# Patient Record
Sex: Female | Born: 1955 | Race: Black or African American | Hispanic: No | Marital: Single | Smoking: Light tobacco smoker
Health system: Southern US, Community
[De-identification: ages and names within clinical notes are randomized; demographics above are authoritative.]

## PROBLEM LIST (undated history)

## (undated) DIAGNOSIS — R531 Weakness: Secondary | ICD-10-CM

## (undated) DIAGNOSIS — M542 Cervicalgia: Secondary | ICD-10-CM

## (undated) DIAGNOSIS — M797 Fibromyalgia: Secondary | ICD-10-CM

## (undated) DIAGNOSIS — I639 Cerebral infarction, unspecified: Secondary | ICD-10-CM

## (undated) DIAGNOSIS — R739 Hyperglycemia, unspecified: Secondary | ICD-10-CM

## (undated) DIAGNOSIS — D649 Anemia, unspecified: Secondary | ICD-10-CM

## (undated) DIAGNOSIS — R202 Paresthesia of skin: Secondary | ICD-10-CM

## (undated) DIAGNOSIS — K219 Gastro-esophageal reflux disease without esophagitis: Secondary | ICD-10-CM

## (undated) DIAGNOSIS — G4730 Sleep apnea, unspecified: Secondary | ICD-10-CM

## (undated) HISTORY — PX: BREAST SURGERY: SHX581

## (undated) HISTORY — PX: CHOLECYSTECTOMY: SHX55

## (undated) HISTORY — PX: DIAGNOSTIC LAPAROSCOPY WITH REMOVAL OF ECTOPIC PREGNANCY: SHX6449

---

## 2007-06-24 ENCOUNTER — Other Ambulatory Visit: Payer: Self-pay

## 2007-06-24 ENCOUNTER — Emergency Department: Payer: Self-pay | Admitting: Unknown Physician Specialty

## 2007-06-25 ENCOUNTER — Ambulatory Visit: Payer: Self-pay | Admitting: Unknown Physician Specialty

## 2008-12-04 ENCOUNTER — Emergency Department (HOSPITAL_COMMUNITY): Admission: EM | Admit: 2008-12-04 | Discharge: 2008-12-04 | Payer: Self-pay | Admitting: Emergency Medicine

## 2009-02-07 ENCOUNTER — Encounter: Admission: RE | Admit: 2009-02-07 | Discharge: 2009-02-07 | Payer: Self-pay | Admitting: Otolaryngology

## 2009-09-26 ENCOUNTER — Ambulatory Visit: Payer: Self-pay

## 2009-10-11 ENCOUNTER — Ambulatory Visit: Payer: Self-pay

## 2009-10-16 ENCOUNTER — Ambulatory Visit: Payer: Self-pay | Admitting: General Surgery

## 2009-10-23 ENCOUNTER — Ambulatory Visit: Payer: Self-pay | Admitting: General Surgery

## 2010-09-24 ENCOUNTER — Ambulatory Visit: Payer: Self-pay | Admitting: Cardiology

## 2010-09-24 ENCOUNTER — Inpatient Hospital Stay: Payer: Self-pay | Admitting: Internal Medicine

## 2010-12-01 ENCOUNTER — Emergency Department: Payer: Self-pay | Admitting: Emergency Medicine

## 2011-10-04 LAB — HEPATIC FUNCTION PANEL
ALT: 41 U/L — ABNORMAL HIGH (ref 0–35)
Albumin: 3.6 g/dL (ref 3.5–5.2)
Alkaline Phosphatase: 55 U/L (ref 39–117)
Total Protein: 6.9 g/dL (ref 6.0–8.3)

## 2011-10-04 LAB — CBC
Platelets: 325 10*3/uL (ref 150–400)
WBC: 5 10*3/uL (ref 4.0–10.5)

## 2011-10-04 LAB — DIFFERENTIAL
Lymphocytes Relative: 38 % (ref 12–46)
Lymphs Abs: 1.9 10*3/uL (ref 0.7–4.0)
Neutro Abs: 2.4 10*3/uL (ref 1.7–7.7)
Neutrophils Relative %: 48 % (ref 43–77)

## 2011-10-04 LAB — BASIC METABOLIC PANEL
BUN: 9 mg/dL (ref 6–23)
Creatinine, Ser: 0.72 mg/dL (ref 0.4–1.2)
GFR calc non Af Amer: >60 mL/min (ref >60)
Potassium: 4.3 mEq/L (ref 3.5–5.1)

## 2011-10-04 LAB — URINE MICROSCOPIC-ADD ON

## 2011-10-04 LAB — URINALYSIS, ROUTINE W REFLEX MICROSCOPIC
Glucose, UA: NEGATIVE mg/dL
Nitrite: NEGATIVE
Specific Gravity, Urine: 1.025 (ref 1.005–1.030)
pH: 7 (ref 5.0–8.0)

## 2011-10-04 LAB — LIPASE, BLOOD: Lipase: 39 U/L (ref 11–59)

## 2013-07-07 ENCOUNTER — Emergency Department: Payer: Self-pay | Admitting: Emergency Medicine

## 2013-07-07 LAB — URINALYSIS, COMPLETE
Bilirubin,UR: NEGATIVE
Ketone: NEGATIVE
Leukocyte Esterase: NEGATIVE
Nitrite: NEGATIVE
Ph: 6 (ref 4.5–8.0)
Protein: NEGATIVE
RBC,UR: 8 /HPF (ref 0–5)
Specific Gravity: 1.019 (ref 1.003–1.030)
Squamous Epithelial: 6
WBC UR: <1 /HPF (ref 0–5)

## 2013-07-07 LAB — CBC
HCT: 37.5 % (ref 35.0–47.0)
MCV: 74 fL — ABNORMAL LOW (ref 80–100)
Platelet: 390 10*3/uL (ref 150–440)

## 2013-07-07 LAB — CK TOTAL AND CKMB (NOT AT ARMC): CK, Total: 57 U/L (ref 21–215)

## 2013-07-07 LAB — TROPONIN I
Troponin-I: <0.02 ng/mL
Troponin-I: <0.02 ng/mL

## 2013-07-07 LAB — COMPREHENSIVE METABOLIC PANEL
Alkaline Phosphatase: 71 U/L (ref 50–136)
Anion Gap: 7 (ref 7–16)
BUN: 7 mg/dL (ref 7–18)
Creatinine: 0.74 mg/dL (ref 0.60–1.30)
Osmolality: 271 (ref 275–301)
SGOT(AST): 17 U/L (ref 15–37)
SGPT (ALT): 19 U/L (ref 12–78)
Sodium: 137 mmol/L (ref 136–145)

## 2013-07-07 LAB — MAGNESIUM: Magnesium: 2.1 mg/dL

## 2013-07-07 LAB — LIPASE, BLOOD: Lipase: 183 U/L (ref 73–393)

## 2014-08-11 IMAGING — CT CT CHEST-ABD W/ CM
1 of 3 series · 14 of 36 positions shown, 19 images · IV contrast (APPLIED)
Comparison: none

REASON FOR EXAM: (1) epig pain rad to back with dropo in bp; (2) same;
NOTE: Nursing to Give O
COMMENTS:

[Series 4: soft tissue · axial · 0.68mm/px · z∈[-560,-232]mm · 14 of 125 slices shown, 19 images]
[im 8/125  mediastinal]
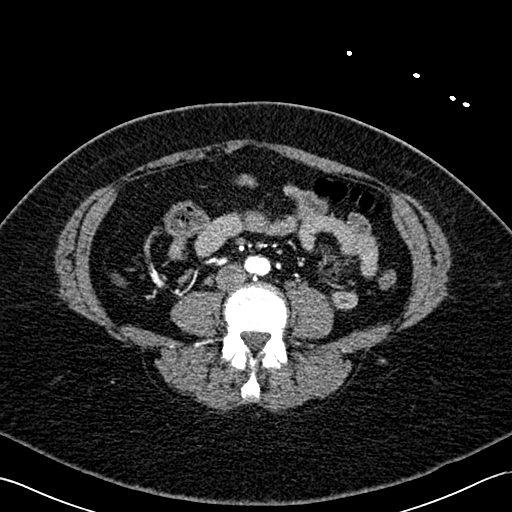
[im 8/125  bone]
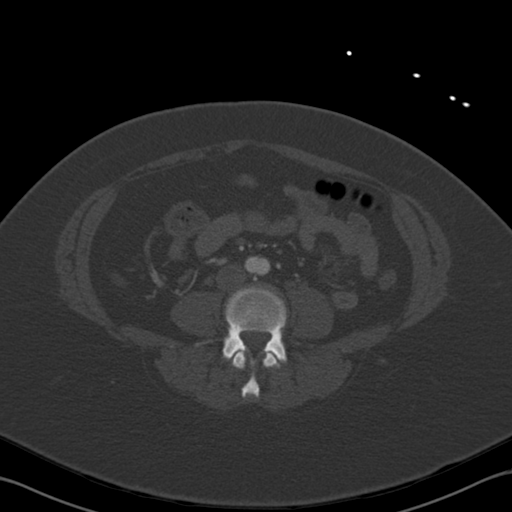
[im 16/125  mediastinal]
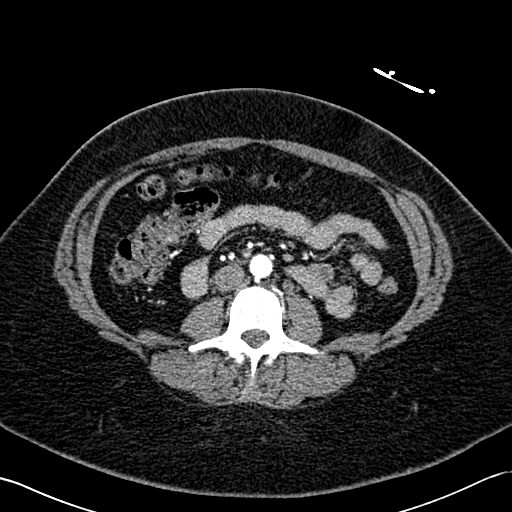
[im 32/125  mediastinal]
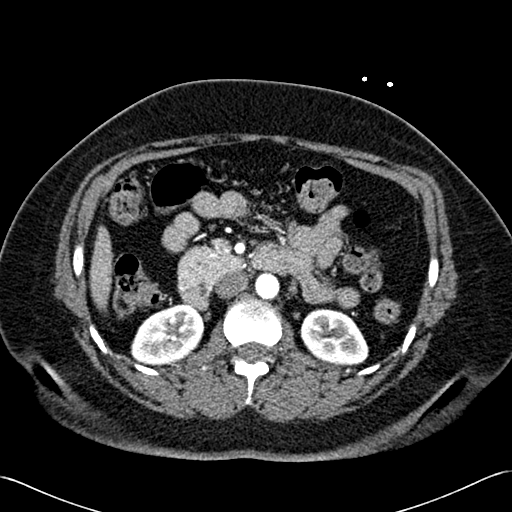
[im 39/125  mediastinal]
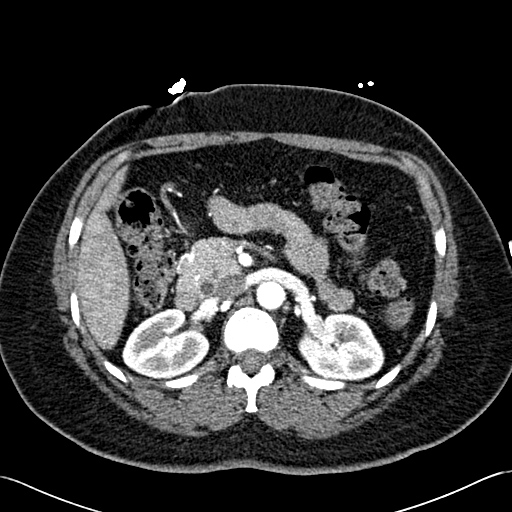
[im 47/125  mediastinal]
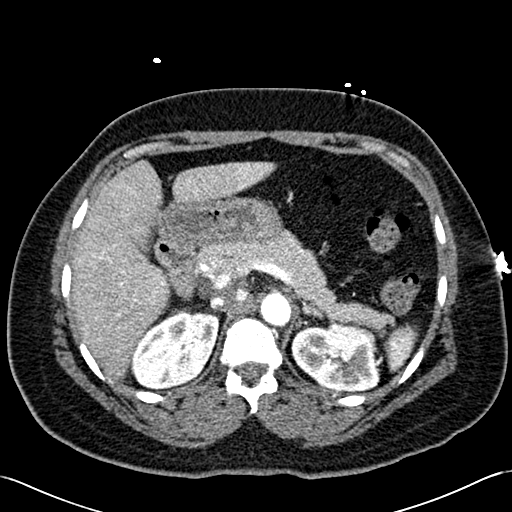
[im 55/125  mediastinal]
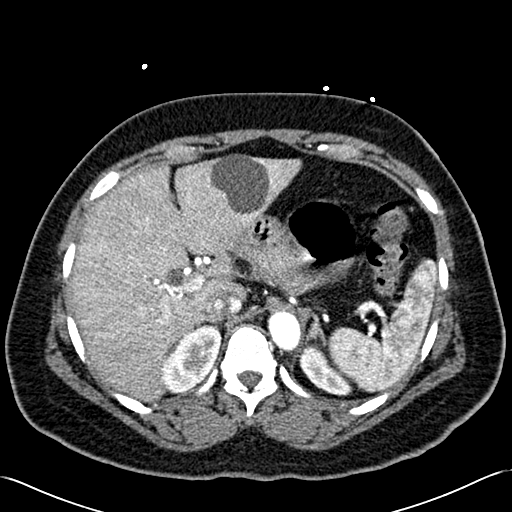
[im 63/125  mediastinal]
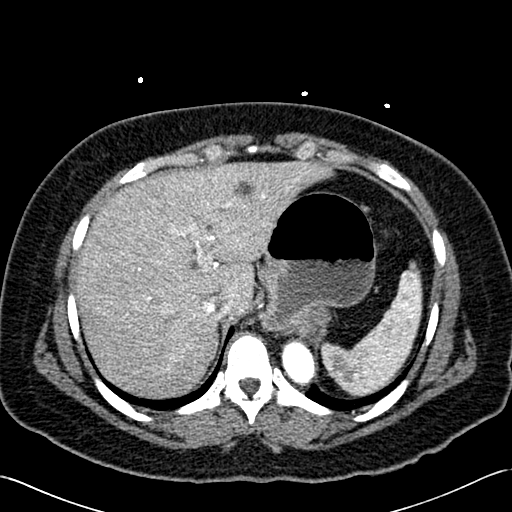
[im 70/125  mediastinal]
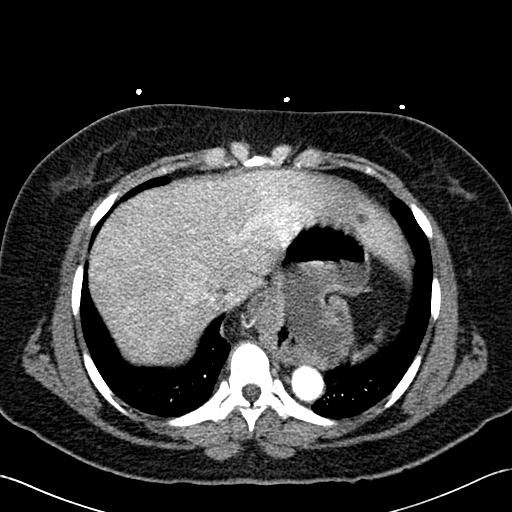
[im 78/125  mediastinal]
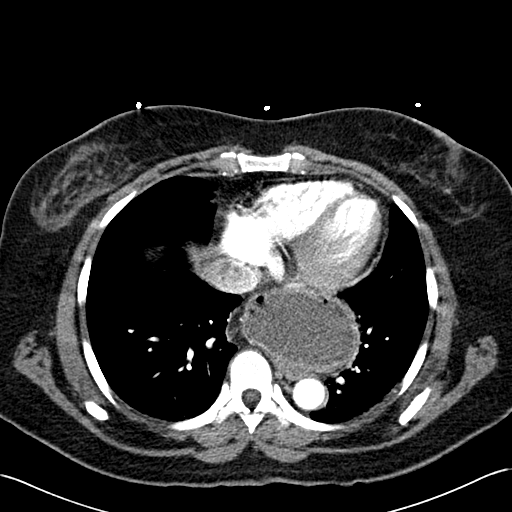
[im 78/125  bone]
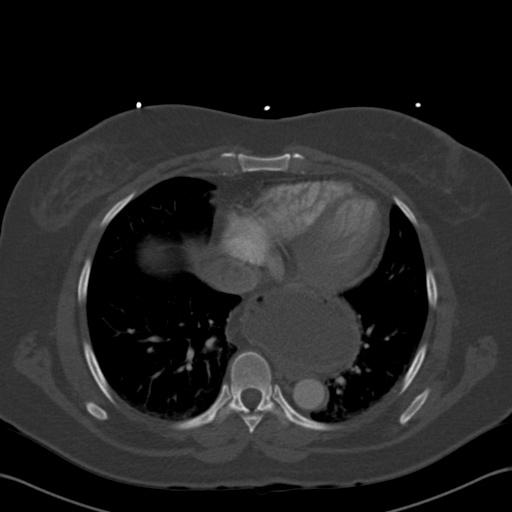
[im 86/125  mediastinal]
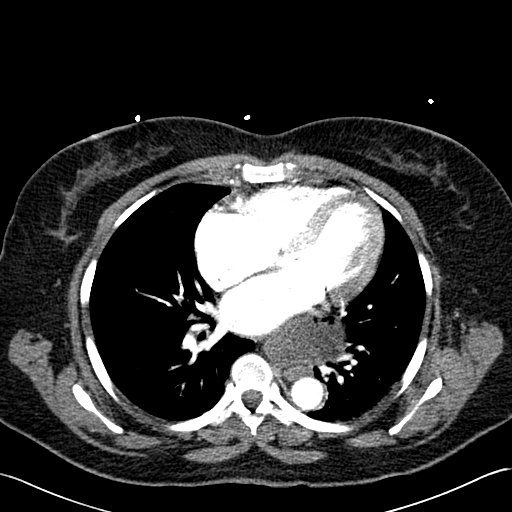
[im 94/125  mediastinal]
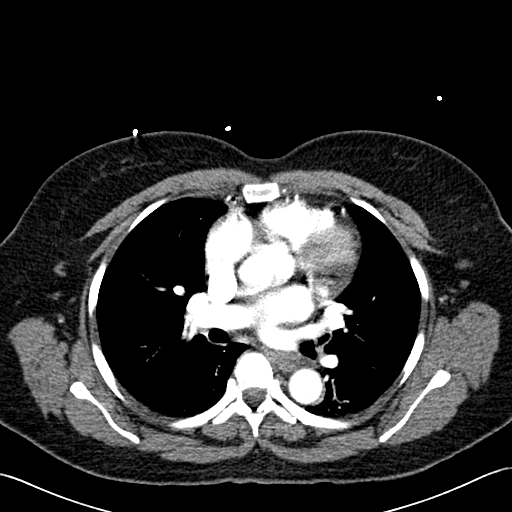
[im 94/125  lung]
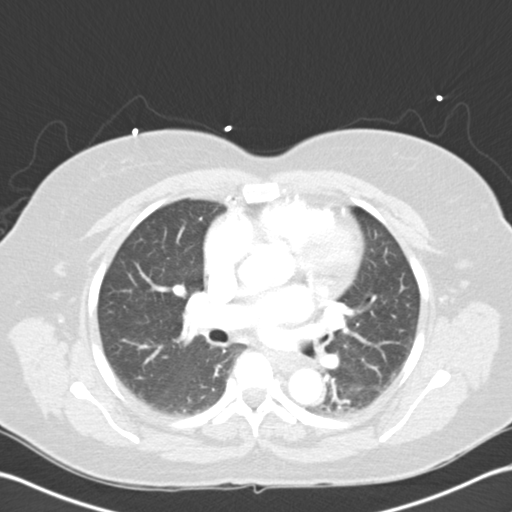
[im 101/125  lung]
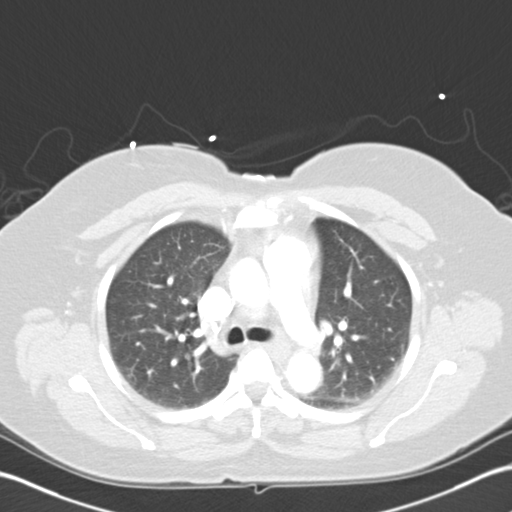
[im 109/125  mediastinal]
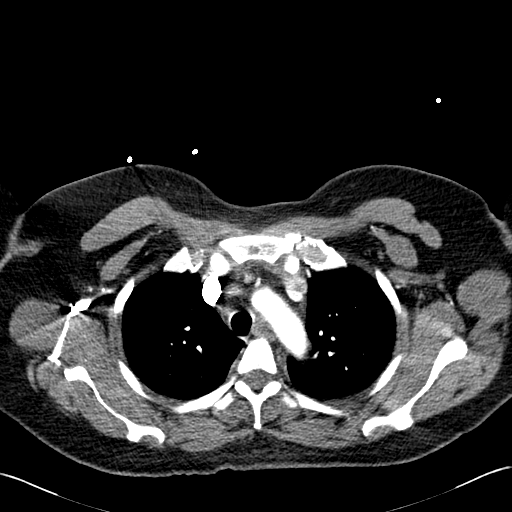
[im 109/125  lung]
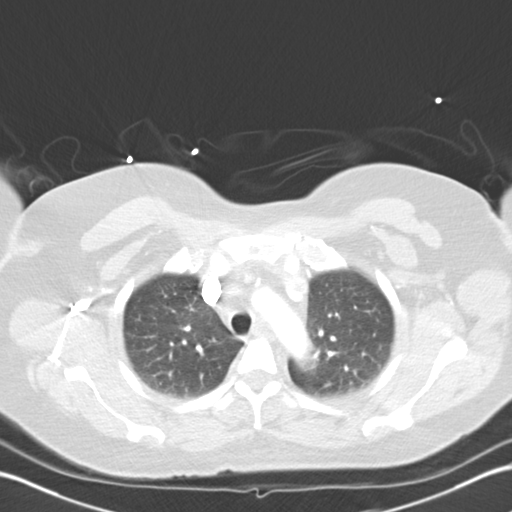
[im 117/125  mediastinal]
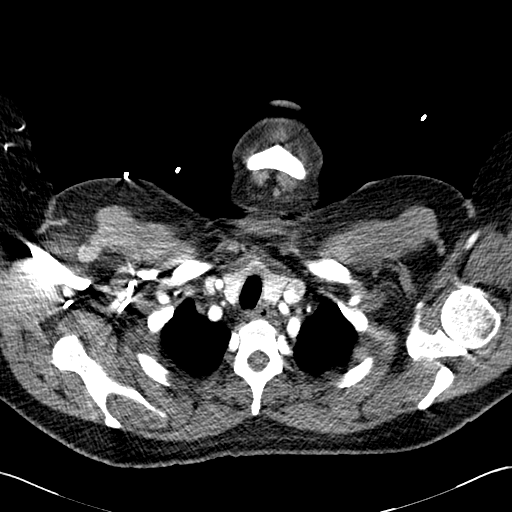
[im 117/125  lung]
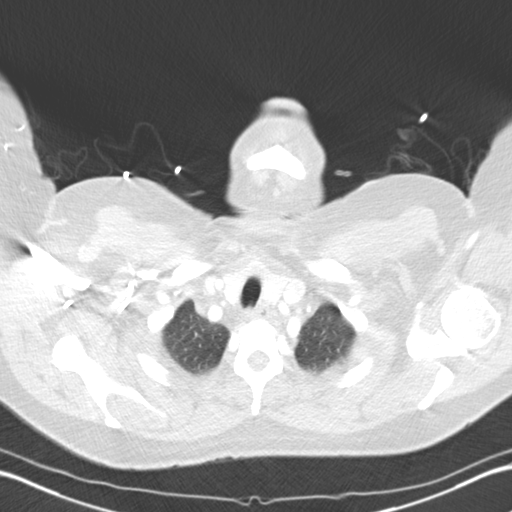

[14 of 36 positions shown; findings below may reference images not displayed]

PROCEDURE:     CT  - CT CHEST AND ABDOMEN W  - July 07, 2013  [DATE]

RESULT:     History: Pain

Comparison Study: Chest x-ray of 07/07/2013. Findings: Standard CT obtained
under 25 cc of Esovue-84C. Thoracic aorta unremarkable. Only arteries are
normal. Heart size normal. Large sliding hiatal hernia. Old 4 cm simple cyst
left lobe of liver. 7 millimeters cyst in the left lobe of liver. Spleen
normal ;splenosis. Pancreas normal. Mild common bile duct distention most
likely from prior cholecystectomy. Cholecystectomy. Adrenals normal. No
focal renal abnormality or hydronephrosis. Abdominal aorta normal in
caliber. Shotty retroperitoneal lymph nodes. Lungs clear. No free air. No
acute bony abnormality.
IMPRESSION: Large sliding hiatal hernia. No acute abnormality.

## 2014-09-09 ENCOUNTER — Other Ambulatory Visit: Payer: Self-pay | Admitting: Neurology

## 2014-09-09 DIAGNOSIS — M4802 Spinal stenosis, cervical region: Secondary | ICD-10-CM

## 2014-09-09 DIAGNOSIS — R202 Paresthesia of skin: Secondary | ICD-10-CM

## 2014-09-09 DIAGNOSIS — R531 Weakness: Secondary | ICD-10-CM

## 2014-09-09 DIAGNOSIS — M542 Cervicalgia: Secondary | ICD-10-CM

## 2014-09-12 ENCOUNTER — Other Ambulatory Visit: Payer: Self-pay | Admitting: Neurology

## 2014-09-18 ENCOUNTER — Ambulatory Visit
Admission: RE | Admit: 2014-09-18 | Discharge: 2014-09-18 | Disposition: A | Discharge status: Home / Self Care | Payer: Managed Care, Other (non HMO) | Source: Ambulatory Visit | Attending: Neurology | Admitting: Neurology

## 2014-09-18 DIAGNOSIS — M542 Cervicalgia: Secondary | ICD-10-CM

## 2014-09-18 DIAGNOSIS — M4802 Spinal stenosis, cervical region: Secondary | ICD-10-CM

## 2014-09-18 DIAGNOSIS — R202 Paresthesia of skin: Secondary | ICD-10-CM

## 2014-09-18 DIAGNOSIS — R531 Weakness: Secondary | ICD-10-CM

## 2014-12-28 LAB — COMPREHENSIVE METABOLIC PANEL
ALK PHOS: 63 U/L
ALT: 114 U/L — AB
ANION GAP: 6 — AB (ref 7–16)
AST: 77 U/L — AB (ref 15–37)
Albumin: 3.6 g/dL (ref 3.4–5.0)
BUN: 15 mg/dL (ref 7–18)
Bilirubin,Total: 0.5 mg/dL (ref 0.2–1.0)
CHLORIDE: 106 mmol/L (ref 98–107)
CREATININE: 0.8 mg/dL (ref 0.60–1.30)
Calcium, Total: 8.6 mg/dL (ref 8.5–10.1)
Co2: 26 mmol/L (ref 21–32)
Glucose: 98 mg/dL (ref 65–99)
Osmolality: 276 (ref 275–301)
POTASSIUM: 3.7 mmol/L (ref 3.5–5.1)
SODIUM: 138 mmol/L (ref 136–145)
TOTAL PROTEIN: 7.2 g/dL (ref 6.4–8.2)

## 2014-12-28 LAB — CBC
HCT: 24.4 % — ABNORMAL LOW (ref 35.0–47.0)
HGB: 7.3 g/dL — AB (ref 12.0–16.0)
MCH: 19 pg — ABNORMAL LOW (ref 26.0–34.0)
MCHC: 29.7 g/dL — AB (ref 32.0–36.0)
MCV: 64 fL — AB (ref 80–100)
Platelet: 382 10*3/uL (ref 150–440)
RBC: 3.82 10*6/uL (ref 3.80–5.20)
RDW: 21 % — AB (ref 11.5–14.5)
WBC: 9.2 10*3/uL (ref 3.6–11.0)

## 2014-12-28 LAB — URINALYSIS, COMPLETE
BILIRUBIN, UR: NEGATIVE
BLOOD: NEGATIVE
Bacteria: NONE SEEN
Glucose,UR: NEGATIVE mg/dL (ref 0–75)
Ketone: NEGATIVE
Leukocyte Esterase: NEGATIVE
NITRITE: NEGATIVE
PH: 7 (ref 4.5–8.0)
PROTEIN: NEGATIVE
RBC, UR: NONE SEEN /HPF (ref 0–5)
SPECIFIC GRAVITY: 1.003 (ref 1.003–1.030)
Squamous Epithelial: 2
WBC UR: <1 /HPF (ref 0–5)

## 2014-12-28 LAB — ACETAMINOPHEN LEVEL

## 2014-12-28 LAB — DRUG SCREEN, URINE

## 2014-12-28 LAB — SALICYLATE LEVEL: Salicylates, Serum: <1.7 mg/dL

## 2014-12-28 LAB — ETHANOL

## 2014-12-28 LAB — TROPONIN I

## 2014-12-29 ENCOUNTER — Inpatient Hospital Stay: Payer: Self-pay | Admitting: Internal Medicine

## 2014-12-29 LAB — HEMATOCRIT
HCT: 25.9 % — ABNORMAL LOW (ref 35.0–47.0)
HCT: 29.9 % — ABNORMAL LOW (ref 35.0–47.0)

## 2014-12-29 LAB — HEMOGLOBIN
HGB: 6.3 g/dL — ABNORMAL LOW (ref 12.0–16.0)
HGB: 7.8 g/dL — AB (ref 12.0–16.0)
HGB: 9.3 g/dL — AB (ref 12.0–16.0)

## 2014-12-29 LAB — IRON AND TIBC
IRON SATURATION: 14 %
Iron Bind.Cap.(Total): 433 ug/dL (ref 250–450)
Iron: 62 ug/dL (ref 50–170)
UNBOUND IRON-BIND. CAP.: 371 ug/dL

## 2014-12-30 LAB — CBC WITH DIFFERENTIAL/PLATELET
BASOS ABS: 0.1 10*3/uL (ref 0.0–0.1)
Basophil %: 1.3 %
EOS ABS: 0.3 10*3/uL (ref 0.0–0.7)
Eosinophil %: 4.4 %
HCT: 31.6 % — AB (ref 35.0–47.0)
HGB: 9.6 g/dL — AB (ref 12.0–16.0)
LYMPHS PCT: 29.2 %
Lymphocyte #: 2 10*3/uL (ref 1.0–3.6)
MCH: 21.9 pg — ABNORMAL LOW (ref 26.0–34.0)
MCHC: 30.4 g/dL — ABNORMAL LOW (ref 32.0–36.0)
MCV: 72 fL — ABNORMAL LOW (ref 80–100)
MONO ABS: 0.5 x10 3/mm (ref 0.2–0.9)
MONOS PCT: 7.3 %
NEUTROS PCT: 57.8 %
Neutrophil #: 4 10*3/uL (ref 1.4–6.5)
Platelet: 317 10*3/uL (ref 150–440)
RBC: 4.37 10*6/uL (ref 3.80–5.20)
RDW: 26.5 % — AB (ref 11.5–14.5)
WBC: 6.9 10*3/uL (ref 3.6–11.0)

## 2014-12-30 LAB — BASIC METABOLIC PANEL
Anion Gap: 9 (ref 7–16)
BUN: 7 mg/dL (ref 7–18)
CREATININE: 0.79 mg/dL (ref 0.60–1.30)
Calcium, Total: 8.1 mg/dL — ABNORMAL LOW (ref 8.5–10.1)
Chloride: 111 mmol/L — ABNORMAL HIGH (ref 98–107)
Co2: 23 mmol/L (ref 21–32)
EGFR (African American): >60
GLUCOSE: 70 mg/dL (ref 65–99)
OSMOLALITY: 281 (ref 275–301)
POTASSIUM: 3.8 mmol/L (ref 3.5–5.1)
SODIUM: 143 mmol/L (ref 136–145)

## 2014-12-30 LAB — HEMATOCRIT: HCT: 29.8 % — AB (ref 35.0–47.0)

## 2014-12-30 LAB — PROTIME-INR
INR: 1
Prothrombin Time: 13.5 secs (ref 11.5–14.7)

## 2014-12-30 LAB — HEMOGLOBIN: HGB: 9.3 g/dL — AB (ref 12.0–16.0)

## 2014-12-30 LAB — LIPASE, BLOOD: LIPASE: 271 U/L (ref 73–393)

## 2014-12-31 LAB — CBC WITH DIFFERENTIAL/PLATELET
BASOS ABS: 0.1 10*3/uL (ref 0.0–0.1)
Basophil %: 1 %
EOS ABS: 0.2 10*3/uL (ref 0.0–0.7)
Eosinophil %: 3 %
HCT: 28.6 % — ABNORMAL LOW (ref 35.0–47.0)
HGB: 9 g/dL — ABNORMAL LOW (ref 12.0–16.0)
Lymphocyte #: 1.7 10*3/uL (ref 1.0–3.6)
Lymphocyte %: 27 %
MCH: 22.3 pg — ABNORMAL LOW (ref 26.0–34.0)
MCHC: 31.6 g/dL — ABNORMAL LOW (ref 32.0–36.0)
MCV: 71 fL — ABNORMAL LOW (ref 80–100)
Monocyte #: 0.5 x10 3/mm (ref 0.2–0.9)
Monocyte %: 8 %
Neutrophil #: 3.8 10*3/uL (ref 1.4–6.5)
Neutrophil %: 61 %
PLATELETS: 298 10*3/uL (ref 150–440)
RBC: 4.05 10*6/uL (ref 3.80–5.20)
RDW: 26.8 % — AB (ref 11.5–14.5)
WBC: 6.3 10*3/uL (ref 3.6–11.0)

## 2015-04-03 ENCOUNTER — Ambulatory Visit
Admit: 2015-04-03 | Disposition: A | Discharge status: Home / Self Care | Payer: Self-pay | Attending: Nurse Practitioner | Admitting: Nurse Practitioner

## 2015-04-07 ENCOUNTER — Telehealth: Payer: Self-pay | Admitting: Gastroenterology

## 2015-04-07 NOTE — Telephone Encounter (Signed)
Records were previously reviewed and faxed.  Reviewed by both Dr, Christella HartiganJacobs and Dr. Russella DarStark.  Patient should remain with her current GI.

## 2015-04-07 NOTE — Telephone Encounter (Signed)
Notes are not in Care Everywhere.  [facility].

## 2015-04-22 NOTE — Consult Note (Signed)
Brief Consult Note: Diagnosis: coffee ground emesis, abdominal pain, melena.   Patient was seen by consultant.   Consult note dictated.   Recommend to proceed with surgery or procedure.   Recommend further assessment or treatment.   Orders entered.   Discussed with Attending MD.   Comments: Please seee full Gi consult [phone]#[id number].  Patient admitted with anemia and hematemesis.  hemodynamically stable.  Found to have a marked microcytic anemia, inferring perhaps not just acute anemia blood loss, but likely chronic loss.  Will proceed with EGD tomorrow.  I have discussed the risks benefits and complications of egd to include not limited to bleeding infection perforationa dn sedationa dnshe wishes to proceed.  Will also need colonoscopy when clinically feasible.  also noted abnormal lfts.  Will obtain iron studies on admission blood (has now been transfused) and serologies for chronic hepatitis.   Following.  continue ppi as you are.  Electronic Signatures: Barnetta ChapelSkulskie, Blase Beckner (MD)  (Signed 31-Dec-15 18:27)  Authored: Brief Consult Note   Last Updated: 31-Dec-15 18:27 by Barnetta ChapelSkulskie, Harlis Champoux (MD)

## 2015-04-24 LAB — SURGICAL PATHOLOGY

## 2015-04-26 NOTE — Consult Note (Signed)
Chief Complaint:  Subjective/Chief Complaint seen for abdominal pain and hematemesis/coffee ground.  feeling some better today.  continues with upper abdominal pain but no nausea or emesis, no bm.   VITAL SIGNS/ANCILLARY NOTES: **Vital Signs.:   01-Jan-16 07:48  Vital Signs Type Q 8hr  Temperature Temperature (F) 98  Celsius 36.6  Temperature Source oral  Respirations Respirations 18  Systolic BP Systolic BP 415  Diastolic BP (mmHg) Diastolic BP (mmHg) 80  Mean BP 98  Pulse Ox % Pulse Ox % 98  Pulse Ox Activity Level  At rest  Oxygen Delivery Room Air/ 21 %  *Intake and Output.:   Shift 01-Jan-16 15:00  Length of Stay Totals Intake:  3056.34 Output:  3380    Net:  -323.66   Brief Assessment:  Cardiac Regular   Respiratory clear BS   Gastrointestinal details normal Soft  Nondistended  Bowel sounds normal  No rebound tenderness  tender to palpation in ruq/rlq, epigastrum.   Lab Results: Routine Chem:  01-Jan-16 05:32   Glucose, Serum 70  BUN 7  Creatinine (comp) 0.79  Sodium, Serum 143  Potassium, Serum 3.8  Chloride, Serum  111  CO2, Serum 23  Calcium (Total), Serum  8.1  Anion Gap 9  Osmolality (calc) 281  eGFR (African American) >60  eGFR (Non-African American) >60 (eGFR values <15m/min/1.73 m2 may be an indication of chronic kidney disease (CKD). Calculated eGFR, using the MRDR Study equation, is useful in  patients with stable renal function. The eGFR calculation will not be reliable in acutely ill patients when serum creatinine is changing rapidly. It is not useful in patients on dialysis. The eGFR calculation may not be applicable to patients at the low and high extremes of body sizes, pregnant women, and vegetarians.)  Routine Coag:  01-Jan-16 05:32   Prothrombin 13.5  INR 1.0 (INR reference interval applies to patients on anticoagulant therapy. A single INR therapeutic range for coumarins is not optimal for all indications; however, the suggested  range for most indications is 2.0 - 3.0. Exceptions to the INR Reference Range may include: Prosthetic heart valves, acute myocardial infarction, prevention of myocardial infarction, and combinations of aspirin and anticoagulant. The need for a higher or lower target INR must be assessed individually. Reference: The Pharmacology and Management of the Vitamin K  antagonists: the seventh ACCP Conference on Antithrombotic and Thrombolytic Therapy. CAXENM.0768Sept:126 (3suppl): [id number] A HCT value >55% may artifactually increase the PT.  In one study,  the increase was an average of 25%. Reference:  "Effect on Routine and Special Coagulation Testing Values of Citrate Anticoagulant Adjustment in Patients with High HCT Values." American Journal of Clinical Pathology 2006;126:400-405.)  Routine Hem:  01-Jan-16 05:32   WBC (CBC) 6.9  RBC (CBC) 4.37  Hemoglobin (CBC)  9.6  Hematocrit (CBC)  31.6  Platelet Count (CBC) 317  MCV  72  MCH  21.9  MCHC  30.4  RDW  26.5  Neutrophil % 57.8  Lymphocyte % 29.2  Monocyte % 7.3  Eosinophil % 4.4  Basophil % 1.3  Neutrophil # 4.0  Lymphocyte # 2.0  Monocyte # 0.5  Eosinophil # 0.3  Basophil # 0.1 (Result(s) reported on 30 Dec 2014 at 06:50AM.)   Assessment/Plan:  Assessment/Plan:  Assessment 1) abdominal pain-slow improvement.  2) coffeeground emesis-none sine admission, no melena since admission though heme positive.  on iv ppi.  3) constipation-will start miralax bid after proceedure today. will need colonoscopy in the near future.   Plan 1)  egd today.  I have discussed the risks sbenefits and complications of proceedure to include not limited to bleeding infection perforation and sedation and she wishes to proceed.   Electronic Signatures: Loistine Simas (MD)  (Signed 01-Jan-16 09:53)  Authored: Chief Complaint, VITAL SIGNS/ANCILLARY NOTES, Brief Assessment, Lab Results, Assessment/Plan   Last Updated: 01-Jan-16 09:53 by Loistine Simas (MD)

## 2015-04-26 NOTE — Discharge Summary (Signed)
PATIENT NAME:  Julia [name], Julia [name] MR#:  [id number] DATE OF BIRTH:  June 13, 1956  DATE OF ADMISSION:  12/29/2014 DATE OF DISCHARGE:  12/31/2014  ADMITTING DIAGNOSES:  1.  Hematemesis, likely upper gastrointestinal bleed.  2.  Gastroesophageal reflux disease.  3.  Hiatal hernia.  4.  Behavioral health consult.   DISCHARGE DIAGNOSES:  1.  Upper gastrointestinal bleed from hematemesis.  2.  Large hiatal hernia.  3.  Nonbleeding erosive gastropathy.  4.  Enlarged uterus with masses probably from fibroids.  5.  Iron deficiency anemia.  6.  Behavioral health concerns.    CONSULTATIONS: GI; psychiatry; surgery, Dr. Katrinka BlazingSmith.   PROCEDURES: EGD on 12/30/2014.   HOSPITAL COURSE: The patient is a 59 year old African American female who came into the ED with a chief complaint of vomiting of blood. The patient was also complaining of abdominal pain and 4 episodes of hematemesis.   Vomiting of blood clots. The patient takes meloxicam at home for pain as needed basis. Please review history and physical for details. The patient denies any diarrhea or melena at the time of admission. The patient was admitted to the hospitalist service. She was kept n.p.o. Hemoglobin and hematocrit were monitored closely. The patient was placed on PPI. Gastroenterology consult is placed. Patient was hemodynamically stable. By the next day her hemoglobin dropped to 7.3 to 6.3; 3 units of pack red blood cells were given and hemoglobin went up to 9.6 following that.  We were monitoring hemoglobin closely. The patient was evaluated by gastroenterology, Dr. Marva PandaSkulskie. After explaining the pros and cons, the patient was scheduled for EGD. EGD was done on January 1, which showed hiatal hernia, nonbleeding erosive gastropathy, esophageal motility disorder. Dr. Marva PandaSkulskie has recommended to give proton pump inhibitor twice a day and sucralfate q. 6 hours with meals. He has recommended surgical consult because of the large hiatal hernia. The  patient was subsequently evaluated by Dr. Katrinka BlazingSmith with surgery who has recommended weight loss and colonoscopy for her iron deficiency anemia. Also, he has recommended iron supplements. The patient did not have any other episodes of hematemesis during the hospital course. Hemoglobin was stable today, her hemoglobin is at 9.3. The patient denies melena during the entire hospital course. For constipation laxatives were given, and the constipation has resolved. Dr. Katrinka BlazingSmith has recommended lifestyle changes and weight loss to manage her acid reflux. He also has recommended upper gastrointestinal series and if the patient is still considering surgery has recommended gastric emptying study and esophageal manometry. The patient is started on proton pump inhibitor during the hospital course. Sucralfate is added to the regimen, iron supplements were added. Hemoglobin is stable. After discussing with GI and surgery, decision is made to discharge the patient home in stable condition.   For hiatal hernia patient should try first try lifestyle modifications and work towards loosing weight. I have asked the patient to stop taking meloxicam and nonsteroidal anti-inflammatories. Also, have recommended the patient to stop taking peppermint and drinking alcohol which will  aggravate the acid reflux.   Iron deficiency anemia, microcytic. The patient is started on iron supplements with laxatives. The patient has a followup with gastroenterology in 3 weeks for colonoscopy. GI is considering upper GI series. Appreciate Dr. Reyes IvanSkulskie's recommendations.   possibly fibroids. The patient denies any periods at this time. She states that she is in menopause. We have recommended the patient to follow up with gynecology as an outpatient for further evaluation.   Behavioral concerns. The patient was closely observed by  a Comptroller. She denies any suicidal thoughts or ideations. No further behavioral issues were noticed during the entire hospital  course. Psychiatry consult was placed but, as it was the holiday season no inpatient psychiatric is available. The patient is to follow up with her primary care physician and she needs to get referral to psychiatry as an outpatient, but at this point of time, no other the behavioral issues were noticed, as mentioned by the admitting physician at the time of admission.    CONDITION AT TIME OF DISCHARGE: Stable.   PHYSICAL EXAMINATION: VITAL SIGNS: On 01/02: Temperature 97.8, pulse 65, respirations 18, blood pressure 130/76, pulse oximetry 100% on room air.   GENERAL APPEARANCE: Not in any acute distress, obese.  HEENT: Normocephalic, atraumatic. Pupils equal, react to light and accommodation. No scleral icterus. No conjunctival injection. No sinus tenderness. Moist mucous membranes.  NECK: Supple. No JVD. No thyromegaly. Range of motion is intact.  LUNGS: Clear to auscultation bilaterally. No accessory muscle use. No anterior chest wall tenderness on palpation.  CARDIAC: S1, S2 normal. Regular rate and rhythm. No murmur.  GASTROINTESTINAL: Soft. Bowel sounds are positive in all 4 quadrants. Nontender, nondistended. No hepatosplenomegaly. No masses felt.  NEUROLOGIC: Awake, alert, and oriented x 3. Motor and sensory are grossly intact, follows verbal commands.  EXTREMITIES: No cyanosis. No clubbing.  SKIN: Warm to touch. Normal turgor. No rashes. No lesions.  MUSCULOSKELETAL: No joint effusion, tenderness, erythema.  PSYCHIATRIC: Normal mood and affect.   LABORATORY AND IMAGING STUDIES: January 01: BUN, creatinine, glucose, sodium, potassium, and CO2 are normal. GFR greater than 60. Hemoglobin after 2 units of PRBC transfusion went up from 6.3 to 9.3 and was monitored closely and today patient's hemoglobin was at 9.0 with a hematocrit of 28.6, platelet count is at 298,000. MCV is 71. CAT scan of the abdomen and pelvis, this study was performed December 30, no acute process in the abdomen and pelvis,  moderate hiatal hernia without acute complication, fibroid uterus was noted. Chest x-ray, portable, single view, moderate hiatal hernia, moderate cardiomegaly, no acute disease.   MEDICATIONS AT THE TIME OF DISCHARGE: Iron sulfate 325 mg p.o. b.i.d., Colace 100 mg p.o. b.i.d., Tylenol 325 mg 2 tablets p.o. every 4 hours as needed for pain, pantoprazole 40 mg 1 tablet p.o. b.i.d., sucralfate 1 gram p.o. every 6 hours with meals, methocarbamol 500 mg 2 tablets p.o. 4 times a day as needed for muscle spasms, discontinue meloxicam.  DIET AT THE TIME OF DISCHARGE: Regular and iron rich, regular consistency.   ACTIVITY: As tolerated.   FOLLOWUP:  1.  Follow up with primary care physician in a week. PCP to consider referring the patient to outpatient psychiatry and behavioral health for counseling for behavioral issues.   2.  Follow up with Dr. Marva Panda in 3 weeks.  3.  GYN in 2-3 weeks for possible fibroids.  4.  Follow up with surgery, Dr. Katrinka Blazing, in 4-6 weeks regarding hiatal hernia surgery. 5.  GI to consider getting an outpatient upper GI series.   Diagnosis and plan of care was discussed in detail with the patient and her family. She verbalized understanding of the plan.   TOTAL TIME SPENT ON THE DISCHARGE: 45 minutes.    ____________________________ Ramonita Lab, MD ag:bm D: 12/31/2014 20:04:00 ET T: 12/31/2014 22:56:55 ET JOB#: [id number]  cc: Ramonita Lab, MD, <Dictator> Christena Deem, MD J. Renda Rolls, MD Primary Care Physician   Ramonita Lab MD ELECTRONICALLY SIGNED 01/09/2015 22:29

## 2015-04-26 NOTE — Consult Note (Signed)
PATIENT NAME:  Julia [name], Julia [name] MR#:  [id number] DATE OF BIRTH:  1956-01-01  DATE OF CONSULTATION:  12/29/2014  REFERRING PHYSICIAN:   CONSULTING PHYSICIAN:  Christena DeemMartin U. Goerge Mohr, MD  A patient of Dr. Amado CoeGouru.   REASON FOR CONSULTATION: Upper GI bleed.   HISTORY OF PRESENT ILLNESS: Julia [name] is a 59 year old African American female who states that she awoke yesterday at about 2:00 a.m. with some epigastric and chest pain. She then underwent some emesis that was dark in nature. This recurred last night. She has been having increasing weakness for about 2 weeks. She does deny having any black stools. She does have a history of taking herbal tea for problems with constipation. She came to the Emergency Room with this weakness and emesis, and was found to be markedly anemic and microcytic. She states that she has had an EGD and a colonoscopy, both of these being well over 7 years or so ago. She does not remember the results. There are not in our hospital system. She states that she was started on some gabapentin recently and relates that this seemed to increase her GERD symptoms for the past couple of weeks. She does take Nexium once a day. She has problems with chronic constipation. She has been taking NSAIDs, to include meloxicam, on a daily basis. She has been hemodynamically stable and has not had any recurrent emesis since admission.   PAST MEDICAL HISTORY: History of gastroesophageal reflux, irritable bowel syndrome, hiatal hernia. She has had a cholecystectomy, as well as a breast biopsy, benign, She has apparently had some type of esophageal procedure. This sounds like, perhaps, a dilatation. She states that her constipation has been getting worse. She denies, currently, any dysphagia, although she does have heartburn, and her proton pump inhibitor does not seem to be working as well for this. Fibromyalgia.   SOCIAL HISTORY: The patient occasionally drank alcohol, does not use illicit drugs and  does smoke cigarettes regularly.   GASTROINTESTINAL FAMILY HISTORY: Pertinent for father with colon cancer as well as prostate cancer. There is no family history of liver disease or ulcers.Marland Kitchen.   REVIEW OF SYSTEMS: Ten systems reviewed per admission history and physical. Agree with same.  Epigastric burning for a couple of weeks.   CURRENT OUTPATIENT MEDICATIONS: Include: Esomeprazole 40 mg once a day, meloxicam 15 mg daily, and methocarbamol 500 mg daily. She also stated that she is taking some, what sounds like, gabapentin.   PHYSICAL EXAMINATION: VITAL SIGNS: Temperature is 98.3, pulse 80, respirations 16, blood pressure 123/87, pulse oximetry 100%.  GENERAL: She is a 59 year old African American female, appears somewhat anxious, no acute distress.  HEENT: Normocephalic, atraumatic. Eyes are anicteric. Nose: Septum midline. Oropharynx: No lesions.  NECK: No JVD.  HEART: Regular rate and rhythm.  LUNGS: Clear.  ABDOMEN: Soft. She is markedly tender to palpation in the epigastric region, as well as across the lower abdomen. There are no masses, rebound, or organomegaly.  ANORECTAL: Does show trace black stool that is Hemoccult positive.  EXTREMITIES: No clubbing, cyanosis, or edema.  NEUROLOGICAL: Cranial nerves II through XII grossly intact. Muscle strength bilaterally equal and symmetric.   LABORATORY DATA: Includes the following: Yesterday, she had a metabolic panel, which was normal. A hepatic profile showing an AST of 77, ALT of 114. Normal alkaline phosphatase, bilirubin, albumin and total protein. She had a urine drug screen, which was negative for amphetamines, barbiturates, benzodiazepine, cocaine, cannabinoid, opiates, methadone, phencyclidine, tricyclic antidepressants, and MDMA. She has had several  hemograms as follows: Last night, she had a white count of 9.2, H and H 7.3/24.4, platelet count of 382. MCV is 64. A repeat hemoglobin earlier this morning had declined to 6.3. A repeat this  evening showed a 7.8 after transfusion of 2 units of blood. UA was negative. Serum acetaminophen and salicylates were both negative, and lactic acid was 0.8.   IMAGING STUDIES: She had a CT scan of the abdomen and pelvis showing no acute process in the abdomen or pelvis, a moderate hiatal hernia without acute complication, and a fibroid uterus. She had a portable chest film that is showing a moderate-sized hiatal hernia, borderline cardiomegaly without active or acute disease.   ASSESSMENT: Hematemesis and black stools/melena. The patient is presenting with anemia  and a marked microcytosis. This would infer that she has been losing blood on a chronic basis for quite some time. She does have a large hiatal hernia reported, and there are lesions that can be associated with these that would give a like presentation. She also has a family history of colon cancer, has not had a colonoscopy for over 7-8 years.   RECOMMENDATIONS: 1. Agree with EGD. I have discussed the risks, benefits, and complications of this procedure to include but not limited to bleeding, infection, perforation, and the risk of sedation, and she wishes to proceed. Since she is currently being transfused. We will allow those to continue and finish, and arrange for scope tomorrow morning.  2. Would check iron studies on admission blood.  3. Colonoscopy when clinically feasible.  4. Avoid NSAIDs and aspirins.  5. Continue PPI, as you are.   ____________________________ Christena Deem, MD mus:mw D: 12/29/2014 18:21:35 ET T: 12/29/2014 18:59:24 ET JOB#: [id number]  cc: Christena Deem, MD, <Dictator> Christena Deem MD ELECTRONICALLY SIGNED 01/10/2015 12:06

## 2015-04-26 NOTE — Consult Note (Signed)
PATIENT NAME:  Julia [name], Julia [name] MR#:  [id number] DATE OF BIRTH:  24-Jan-1956  DATE OF CONSULTATION:  12/31/2014    CONSULTING PHYSICIAN:  Adella Hare, MD  HISTORY OF PRESENT ILLNESS: This 59 year old female was referred by Dr. Amado Coe and Dr. Marva Panda for consultation regarding a hiatus hernia and epigastric pains. She reports that 3 days ago she developed an epigastric pain which radiated through to the back and had associated nausea and vomiting. She said that when she vomited there was a brown discoloration, although did not see any blood.  She reports recent heartburn. She reports she has been taking medicine over a period of years for heartburn but just recently had recurrent heartburn.  She reports no associated chills or fever. She does report chronic tendency towards constipation, which persists.  She typically drinks some tea which helps her to move her bowels. She reports no rectal bleeding in recent weeks, although sometimes in the past has had a small amount of blood on tissue, which has been attributed to hemorrhoids, but she sees no blood mixed in with her bowel movement.  She does report recent fatigue and some dyspnea on exertion.   She was initially evaluated and admitted to the hospital. She did have findings of a profound anemia. She also had CT scan demonstrating hiatus hernia.  Dr. Marva Panda did an upper endoscopy and found evidence of esophageal motility abnormalities.  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  Biopsies were done, duodenum appeared normal. The patient reports her abdomen feels much better now than it did on admission.  She is no longer vomiting and she did eat her breakfast this morning and tolerated that well.   PAST MEDICAL HISTORY: Includes: 1.  Rheumatoid arthritis and fibromyalgia.  2.  She has no history of heart disease. No history lung disease and  no history of hepatitis.  3.  She does report a known history of hiatus hernia and gastroesophageal reflux, which has been treated chronically.   4.  She reports a chronic history of constipation and irritable bowel syndrome.  5.  She reports some mild history of depression.   PAST SURGICAL HISTORY:  1.  Has had open cholecystectomy many years ago.  2.  Has had benign cyst removed from left breast 2 times. She also reports a history of dilatation of the esophagus due to problems with dysphagia, which improved with dilatation.   SOCIAL HISTORY:  1.  She reports her husband died less than 7 years ago.  2.  She does occasionally smoke. 3.  Occasionally drinks some alcohol but not sure whether she has had any in the last 2 weeks or not.   FAMILY HISTORY: Positive for heart disease.   MEDICATIONS: She reports taking:  1.  Pantoprazole 40 mg each day.  2.  Meloxicam 15 mg each day.  3.  Gabapentin. 4.  Methocarbamol 500 mg 2 tablets 4 times a day p.r.n. for muscle spasms.   DRUG ALLERGIES: SULFA, EGGS, LATEX.   REVIEW OF SYSTEMS: She reports no recent acute illness such as cough, cold or sore throat, recently has been having some blurred vision in her right eye which is not present today.  She reports occasional headaches.  Does have a mild headache today.  She reports fatigue and dyspnea on exertion, although no specific dyspnea at rest. She reports no exertional chest pains. She reports no ankle  or pedal edema. She reports no recent sores or boils. She reports she does not have menstrual periods. She reports no urinary symptoms. She reports some degree of mild depression.  She occasionally eats peppermint candy.  Review of systems otherwise negative.   PHYSICAL EXAMINATION:  GENERAL: She is awake, alert, oriented, resting in the hospital bed, appears to walk easily.  VITAL SIGNS: Temperature is 98.6, pulse 75, respirations 18, blood pressure 146/75, and pulse oximetry is 99% on room air.   SKIN: Warm and dry without rash.  HEENT: Pupils equal, reactive to light. Extraocular movements are intact. Sclerae clear. Pharynx clear.  NECK: No palpable mass.  LUNGS: Sounds were clear. No respiratory distress.  HEART: Regular rhythm, S1 and S2 without murmur.  ABDOMEN: Moderately obese with mild right lower quadrant tenderness, mild epigastric tenderness, no palpable mass. No hepatomegaly.  EXTREMITIES: No dependent edema. NEUROLOGIC: Awake, alert, moving all extremities.    LABORATORY DATA:  Her metabolic panel with a creatinine of 0.79. Liver panel with elevated SGOT is 77 and elevated SGPT of 114. Troponin normal. Urinary drug screen was negative.  Her hemoglobin on admission was 7.3 and repeat was 6.3.  She received 2 blood transfusions.  Most recent hemoglobin this morning was 9.0.  Her MCV on admission was 64. Her serum lipase was 271.  Her iron binding capacity 433, iron saturation 14.   I reviewed her CT images, which are of the abdomen and pelvis, there is a large hiatus hernia. The images do not go up to the upper aspect of the stomach and did not go up as far as the esophagus.  There is a large cyst in the right lobe of the liver.  There are multiple large uterine fibroids. There is no evidence of bowel obstruction or other tumor formation or signs of inflammation in the abdomen.    I reviewed the upper endoscopy report with findings of tertiary contractions in the esophagus, evident of esophageal dysmotility.  There were the [facility]os[facility] found in the stomach at the diaphragmatic hiatus.  Biopsies were done.  Duodenum appeared normal.    Her weight recorded as 198 pounds and height 66 inches, BMI 35.3.  IMPRESSION:  1.  Microcytic anemia.  2.  Recent epigastric pain.  3.  Hiatus hernia with probable gastroesophageal reflux.  4.  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 5.  Hepatic cyst.  6.  Large uterine masses, possibly fibroids.  7.  Obesity.  RECOMMENDATIONS:  I recommend  she does follow through with colonoscopy. Also recommend upper GI with small bowel x-ray series for further evaluation of her anemia.  Also recommend a course of iron therapy to try to further resolve her anemia.  Also recommended weight loss as a means of improving her gastroesophageal reflux. Also recommended she avoid peppermint and avoid alcohol and avoid meloxicam.   I discussed with her the role of hiatus hernia repair. I discussed that if she further appears to be a candidate for hiatus hernia repair, would need to do esophageal manometry and described that procedure.  Also would need a gastric emptying study and certainly follow up in the office.     __________________________ J. Renda RollsWilton Smith, MD jws:DT D: 12/31/2014 12:01:00 ET T: 12/31/2014 12:31:00 ET JOB#: [id number]  cc: Adella HareJ. Wilton Smith, MD, <Dictator> Adella HareWILTON J SMITH MD ELECTRONICALLY SIGNED 01/01/2015 11:39

## 2015-04-26 NOTE — H&P (Signed)
PATIENT NAME:  Julia, [name] MR#:  [id number] DATE OF BIRTH:  [birth year]  DATE OF ADMISSION:  12/29/2014  REFERRING PHYSICIAN: Loraine Leriche R. Fanny Bien, MD  PRIMARY CARE PHYSICIAN: [name]   ADMISSION DIAGNOSIS: Hematemesis.   HISTORY OF PRESENT ILLNESS: This is a 59 year old African American female who presents to the Emergency Department complaining of abdominal pain and 4 episodes of vomiting. The patient states that she vomited clots of blood that she originally thought were some olives that she eaten at a salad bar the night before. The blood in the commode never filled the entire toilet bowl or colored the water red. She notes that all of the emesis was dark, although there was the taste of blood in her mouth and clearly some dissolving clots once the emesis hit the water. Her abdominal pain began the night prior to admission. She denies any abdominal trauma. She also denies any diarrhea or blood in her stool. In the Emergency Department laboratory evaluation revealed significant anemia, so the Emergency Department called for admission.   REVIEW OF SYSTEMS:  CONSTITUTIONAL: The patient denies fever, but admits to fatigue.  EYES: Denies blurred vision or inflammation.  EARS, NOSE AND THROAT: Denies tinnitus or sore throat.  RESPIRATORY: Denies cough. Shortness of breath after an episode of vomiting. CARDIOVASCULAR: The patient admits to chest pain that she has associated for years with her hiatal hernia. She denies orthopnea or paroxysmal nocturnal dyspnea.  GASTROINTESTINAL: Denies diarrhea, but admits to vomiting, abdominal pain. Constipation.  GENITOURINARY: Denies dysuria, increased frequency or hesitancy of urination.  ENDOCRINE: Denies polyuria or polydipsia.  HEMATOLOGIC AND LYMPHATIC: Admits to bleeding now that she has seen blood in her vomit; however, she denies easy bruising.  INTEGUMENT: Denies rashes or lesions.  MUSCULOSKELETAL: Denies myalgias or arthralgias.  NEUROLOGIC:  Denies numbness in her extremities or dysarthria.  PSYCHIATRIC: Denies depression or suicidal ideation. (Notably, there was a report from someone, perhaps EMS personnel, that the patient mentioned that she did not want to live anymore. The patient vehemently denies this and states that she has no intention or desire to harm herself or others).   PAST MEDICAL HISTORY: Hiatal hernia, gastroesophageal reflux disease, and IBS.    PAST SURGICAL HISTORY: Esophagoplasty, cholecystectomy, and benign cyst removal from her left breast x 2.   SOCIAL HISTORY: The patient smokes 1 cigarette per day. She occasionally drinks alcohol and denies any illicit drug use.   FAMILY HISTORY: Heart disease throughout the family, as well as some aunts some of whom have died and others are survivors of various forms of cancer.   MEDICATIONS:  1.  Esomeprazole 40 mg delayed release capsule 1 capsule p.o. daily.  2.  Meloxicam 15 mg 1 tablet p.o. once daily as needed for pain.  3.  Methocarbamol 500 mg 2 tablets as needed 4 times a day for muscle spasms.   ALLERGIES: SULFA DRUGS, EGGS, AND LATEX.   PERTINENT LABORATORY RESULTS AND RADIOGRAPHIC FINDINGS: Serum glucose is 98, BUN is 15, creatinine 0.8, sodium is 138, potassium 3.7, chloride 106, bicarbonate is 26, calcium is 8.6. Ethanol level is less than 3. Total bilirubin 0.5, alkaline phosphatase 63, AST 77, ALT 114. Troponin is negative. Urine drug screen is negative. White blood cell count is 9.2, hemoglobin is 7.3, hematocrit is 24.4, platelet count 382,000. Urinalysis is negative for infection. Acetaminophen level and salicylate level below detection. Venous lactic acid is 0.8. Chest x-ray shows borderline cardiomegaly without acute disease. CT of the abdomen and pelvis with contrast  shows no acute process. There is a moderate hiatal hernia without complication and there is fibroid uterus.   PHYSICAL EXAMINATION:  VITAL SIGNS: Temperature is 98, pulse 79, respirations  18, blood pressure 125/76, pulse oximetry 100% on room air.  GENERAL: The patient is alert and oriented x 3 in no apparent distress.  HEENT: Normocephalic, atraumatic. Pupils equal, round, and reactive to light and accommodation. Extraocular movements are intact. Mucous membranes are moist. NECK: Trachea is midline. No adenopathy. Thyroid is nonpalpable and nontender.  CHEST: Symmetric and atraumatic.  CARDIOVASCULAR: Regular rate and rhythm. Normal S1, S2. No rubs, clicks, or murmurs appreciated. The patient has 2+ pedal pulses.  LUNGS: Clear to auscultation bilaterally. Normal effort and excursion.  ABDOMEN: Positive bowel sounds. Soft, diffusely tender left upper quadrant, more so than other areas. There is no guarding or rebound tenderness. There is no hepatosplenomegaly.  GENITOURINARY: Deferred.  MUSCULOSKELETAL: The patient moves all 4 extremities equally. There is 5/5 strength in upper and lower extremities bilaterally.  SKIN: Warm and dry. No rashes or lesions.  EXTREMITIES: No clubbing, cyanosis, or edema.  NEUROLOGIC: Cranial nerves II-XII are grossly intact.  PSYCHIATRIC: Mood is normal. Affect is congruent. The patient has good judgment and insight.   ASSESSMENT AND PLAN: This is a 59 year old female admitted for hematemesis.  1.  Hematemesis, likely upper gastrointestinal bleed: The patient's hemoglobin is low at this time, but she is hemodynamically stable. She likely has a slow gastrointestinal bleed, not only as evidenced by dark clotted blood but also her stable vital signs. We will follow her hemoglobin every 6 hours. I already crossmatched 2 units in the event that we need transfusion. I have ordered a gastroenterology consult as well.  2.  Gastroesophageal reflux disease: The patient is currently on pantoprazole drip.  3.  Hiatal hernia: The patient's CT scan does not indicate any incarceration or ischemia at this time. She has a diffusely tender abdominal exam with some  increased tenderness in the left upper quadrant, which could correlate to the pain from her hiatal hernia; however, she does not have an acute abdomen and thus does not need surgical intervention at this time.  4.  Behavioral health concerns: Initially there was a report the patient may be suicidal but she denies wanting to harm herself or others. The psychiatry consult concurs. Thus, we do not need a sitter or further psychological evaluation.  5.  Deep vein thrombosis prophylaxis: Sequential compression devices.  6.  Gastrointestinal prophylaxis: As above.   CODE STATUS: The patient is a full code.   TIME SPENT ON ADMISSION ORDERS AND PATIENT CARE: Approximately 45 minutes.     ____________________________ Kelton PillarMichael S. Sheryle Hailiamond, MD msd:bm D: 12/29/2014 04:34:54 ET T: 12/29/2014 05:18:03 ET JOB#: [id number]  cc: Kelton PillarMichael S. Sheryle Hailiamond, MD, <Dictator> Kelton PillarMICHAEL S DIAMOND MD ELECTRONICALLY SIGNED 12/30/2014 4:37

## 2016-02-01 IMAGING — CR DG CHEST 1V PORT
1 series · 1 of 1 positions shown · non-contrast
Comparison: 07/07/2013 plain films and CT.

CLINICAL DATA: Chest pain.  Hiatal hernia.

EXAM:
PORTABLE CHEST - 1 VIEW

[ap]
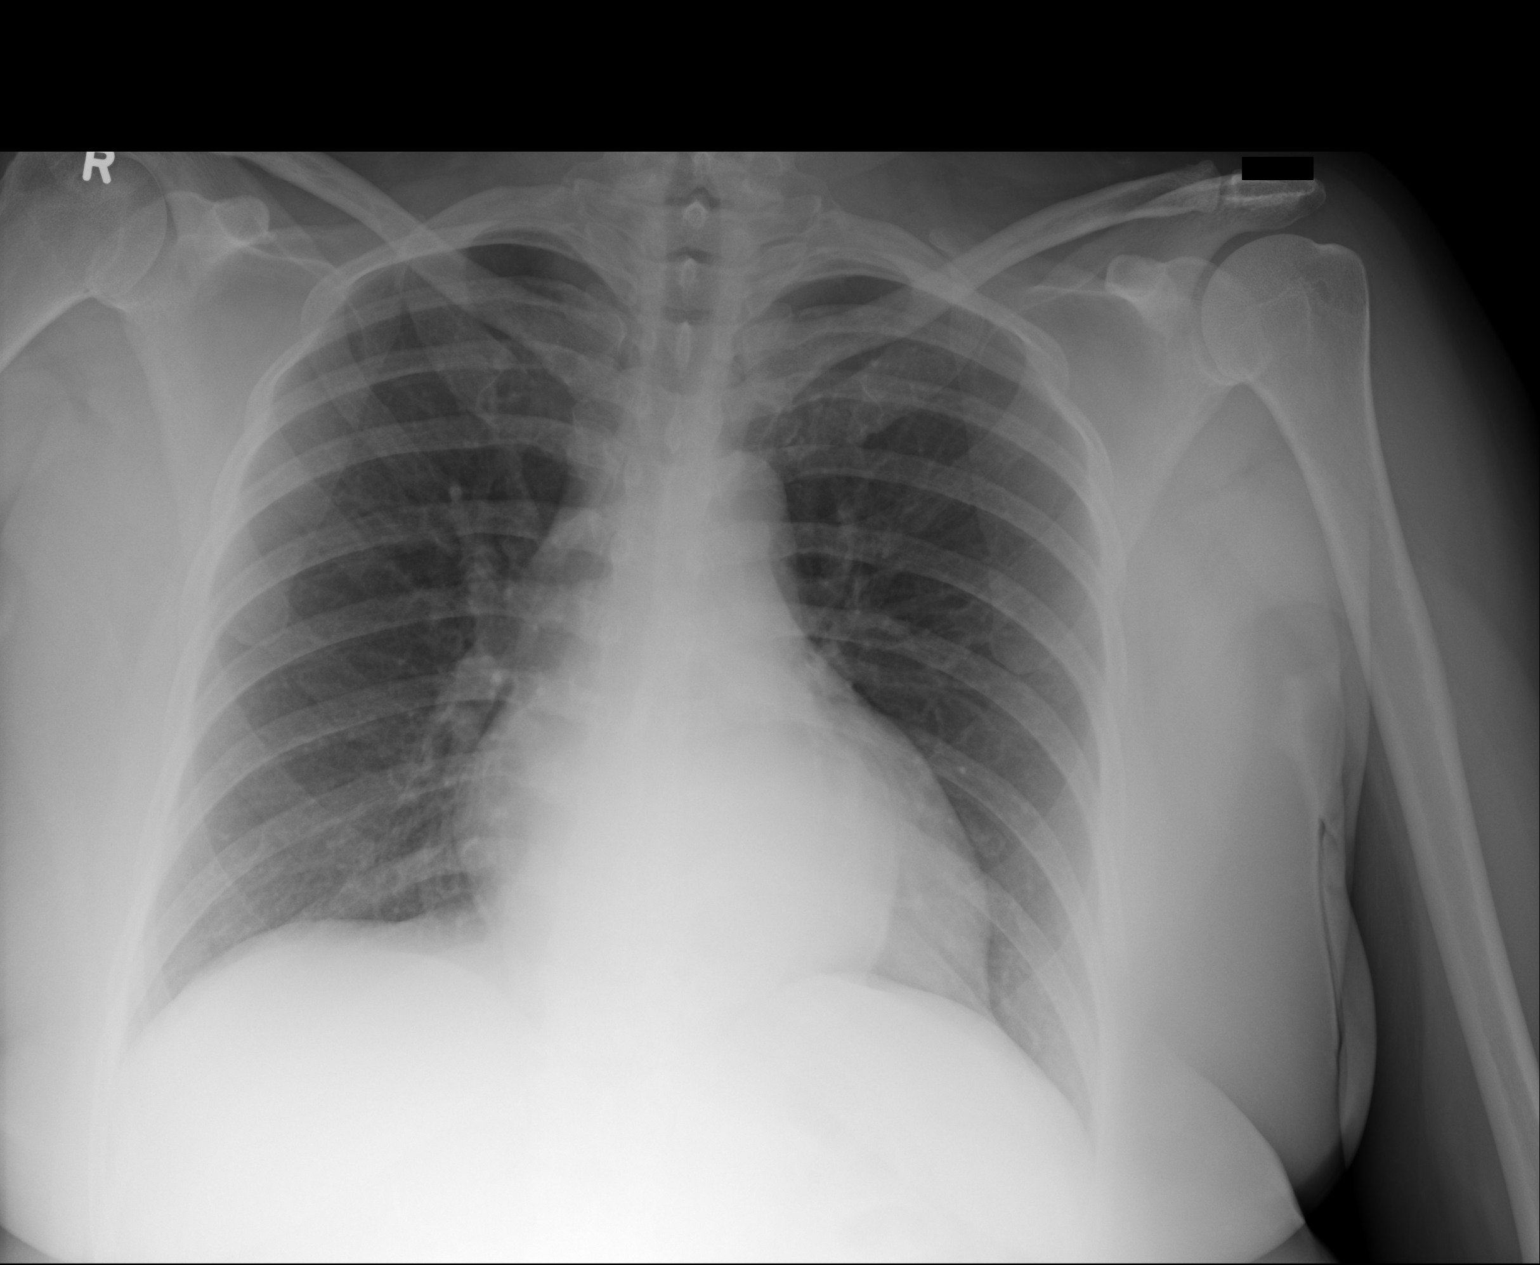

[1 of 1 positions shown; findings below may reference images not displayed]

FINDINGS: Midline trachea. Borderline cardiomegaly. Moderate hiatal hernia,
similar. No pleural effusion or pneumothorax. Clear lungs.
IMPRESSION: Moderate hiatal hernia.

Borderline cardiomegaly, without acute disease.

## 2016-07-23 ENCOUNTER — Ambulatory Visit: Payer: 59 | Admitting: Psychiatry

## 2016-12-09 ENCOUNTER — Telehealth: Payer: Self-pay | Admitting: Surgery

## 2016-12-09 NOTE — Telephone Encounter (Signed)
[employer] left a voice message that they need updated medical records and it is due 12/14. If you have any questions please call [phone]. No name was given.

## 2016-12-10 NOTE — Telephone Encounter (Signed)
Called [employer] and spoke with a representative letting them know that I was returning a call. They looked in the patient's file and stated that they needed to speak to Dr. Dionne Miloichard Cooper [facility]. I told him that they called the wrong Dr. Excell Seltzerooper. I told him that our doctor is a Development worker, international aidgeneral surgeon. He apologized and stated that he will fix it in his system.

## 2017-02-10 ENCOUNTER — Encounter: Payer: Self-pay | Admitting: *Deleted

## 2017-02-11 ENCOUNTER — Encounter: Admission: RE | Payer: Self-pay | Source: Ambulatory Visit

## 2017-02-11 ENCOUNTER — Ambulatory Visit
Admission: RE | Admit: 2017-02-11 | Payer: Managed Care, Other (non HMO) | Source: Ambulatory Visit | Admitting: Gastroenterology

## 2017-02-11 HISTORY — DX: Fibromyalgia: M79.7

## 2017-02-11 HISTORY — DX: Hyperglycemia, unspecified: R73.9

## 2017-02-11 HISTORY — DX: Cervicalgia: M54.2

## 2017-02-11 HISTORY — DX: Weakness: R53.1

## 2017-02-11 HISTORY — DX: Sleep apnea, unspecified: G47.30

## 2017-02-11 HISTORY — DX: Anemia, unspecified: D64.9

## 2017-02-11 HISTORY — DX: Gastro-esophageal reflux disease without esophagitis: K21.9

## 2017-02-11 HISTORY — DX: Paresthesia of skin: R20.2

## 2017-02-11 SURGERY — EGD (ESOPHAGOGASTRODUODENOSCOPY)
Anesthesia: General

## 2017-03-11 ENCOUNTER — Encounter: Admission: RE | Payer: Self-pay | Source: Ambulatory Visit

## 2017-03-11 ENCOUNTER — Ambulatory Visit: Admission: RE | Admit: 2017-03-11 | Payer: Self-pay | Source: Ambulatory Visit | Admitting: Gastroenterology

## 2017-03-11 SURGERY — ESOPHAGOGASTRODUODENOSCOPY (EGD) WITH PROPOFOL
Anesthesia: General

## 2017-03-18 ENCOUNTER — Ambulatory Visit: Payer: Self-pay | Admitting: Pharmacy Technician

## 2017-03-18 ENCOUNTER — Ambulatory Visit: Payer: Self-pay

## 2017-03-18 DIAGNOSIS — Z79899 Other long term (current) drug therapy: Secondary | ICD-10-CM

## 2017-03-20 NOTE — Progress Notes (Signed)
Met with patient completed financial assistance application for  due to recent hospital visit.  Patient agreed to be responsible for gathering financial information and forwarding to appropriate department in [facility].    Completed [facility] application and contract.  Patient agreed to all terms of the [facility] contract.  Patient to provide notarized letter of support, 2017 taxes and checking account statement.    Referred patient to [facility] and provided contact information for [facility].  Patient sees Dr. Burt Knack at [facility] in [place] for her mental health needs.  Refused referral to [employer] and [employer].    Provided patient with community resource material based on her particular needs.    [place] [facility]

## 2017-04-15 ENCOUNTER — Ambulatory Visit: Payer: Self-pay

## 2017-05-12 ENCOUNTER — Telehealth: Payer: Self-pay | Admitting: Pharmacy Technician

## 2017-05-12 NOTE — Telephone Encounter (Signed)
Patient failed to provide 2018 proof of income.  [facility] unable to provide additional [facility] assistance until eligibility is determined.  Sherilyn DacostaBetty J. [name] Care Manager [facility]

## 2017-06-30 ENCOUNTER — Telehealth: Payer: Self-pay | Admitting: Pharmacy Technician

## 2017-06-30 NOTE — Telephone Encounter (Signed)
Received proof of income.  Patient eligible to receive medication assistance at [facility] until 01/30/18 as long as eligibility requirements continue to be met.  [place] [facility]

## 2017-10-24 ENCOUNTER — Encounter: Payer: Self-pay | Admitting: *Deleted

## 2017-10-27 ENCOUNTER — Encounter
Admission: RE | Disposition: A | Discharge status: Home / Self Care | Payer: Self-pay | Source: Ambulatory Visit | Attending: Gastroenterology

## 2017-10-27 ENCOUNTER — Ambulatory Visit: Admit: 2017-10-27 | Payer: Self-pay | Admitting: Gastroenterology

## 2017-10-27 ENCOUNTER — Ambulatory Visit
Admission: RE | Admit: 2017-10-27 | Discharge: 2017-10-27 | Disposition: A | Discharge status: Home / Self Care | Payer: PRIVATE HEALTH INSURANCE | Source: Ambulatory Visit | Attending: Gastroenterology | Admitting: Gastroenterology

## 2017-10-27 ENCOUNTER — Ambulatory Visit: Payer: PRIVATE HEALTH INSURANCE | Admitting: Anesthesiology

## 2017-10-27 ENCOUNTER — Encounter: Payer: Self-pay | Admitting: *Deleted

## 2017-10-27 DIAGNOSIS — K2960 Other gastritis without bleeding: Secondary | ICD-10-CM | POA: Insufficient documentation

## 2017-10-27 DIAGNOSIS — K2950 Unspecified chronic gastritis without bleeding: Secondary | ICD-10-CM | POA: Diagnosis not present

## 2017-10-27 DIAGNOSIS — M797 Fibromyalgia: Secondary | ICD-10-CM | POA: Diagnosis not present

## 2017-10-27 DIAGNOSIS — K219 Gastro-esophageal reflux disease without esophagitis: Secondary | ICD-10-CM | POA: Insufficient documentation

## 2017-10-27 DIAGNOSIS — Q399 Congenital malformation of esophagus, unspecified: Secondary | ICD-10-CM | POA: Diagnosis not present

## 2017-10-27 DIAGNOSIS — J45909 Unspecified asthma, uncomplicated: Secondary | ICD-10-CM | POA: Diagnosis not present

## 2017-10-27 DIAGNOSIS — G4730 Sleep apnea, unspecified: Secondary | ICD-10-CM | POA: Diagnosis not present

## 2017-10-27 DIAGNOSIS — K210 Gastro-esophageal reflux disease with esophagitis: Secondary | ICD-10-CM | POA: Insufficient documentation

## 2017-10-27 DIAGNOSIS — Z79899 Other long term (current) drug therapy: Secondary | ICD-10-CM | POA: Insufficient documentation

## 2017-10-27 DIAGNOSIS — K2210 Ulcer of esophagus without bleeding: Secondary | ICD-10-CM | POA: Insufficient documentation

## 2017-10-27 DIAGNOSIS — D649 Anemia, unspecified: Secondary | ICD-10-CM | POA: Insufficient documentation

## 2017-10-27 DIAGNOSIS — F17200 Nicotine dependence, unspecified, uncomplicated: Secondary | ICD-10-CM | POA: Diagnosis not present

## 2017-10-27 DIAGNOSIS — K449 Diaphragmatic hernia without obstruction or gangrene: Secondary | ICD-10-CM | POA: Insufficient documentation

## 2017-10-27 HISTORY — PX: ESOPHAGOGASTRODUODENOSCOPY (EGD) WITH PROPOFOL: SHX5813

## 2017-10-27 SURGERY — ESOPHAGOGASTRODUODENOSCOPY (EGD) WITH PROPOFOL
Anesthesia: General

## 2017-10-27 MED ORDER — PROPOFOL 500 MG/50ML IV EMUL
INTRAVENOUS | Status: DC | PRN
  Administered 2017-10-27: 120 ug/kg/min via INTRAVENOUS

## 2017-10-27 MED ORDER — ALBUTEROL SULFATE HFA 108 (90 BASE) MCG/ACT IN AERS
INHALATION_SPRAY | RESPIRATORY_TRACT | Status: AC
  Filled 2017-10-27: qty 6.7

## 2017-10-27 MED ORDER — PROPOFOL 10 MG/ML IV BOLUS
INTRAVENOUS | Status: AC
  Filled 2017-10-27: qty 20

## 2017-10-27 MED ORDER — SODIUM CHLORIDE 0.9 % IV SOLN: INTRAVENOUS | Status: DC

## 2017-10-27 MED ORDER — FENTANYL CITRATE (PF) 100 MCG/2ML IJ SOLN
INTRAMUSCULAR | Status: DC | PRN
  Administered 2017-10-27: 50 ug via INTRAVENOUS

## 2017-10-27 MED ORDER — SODIUM CHLORIDE 0.9 % IV SOLN
INTRAVENOUS | Status: DC
  Administered 2017-10-27: 15:00:00 via INTRAVENOUS

## 2017-10-27 MED ORDER — FENTANYL CITRATE (PF) 100 MCG/2ML IJ SOLN
INTRAMUSCULAR | Status: AC
  Filled 2017-10-27: qty 2

## 2017-10-27 MED ORDER — PROPOFOL 10 MG/ML IV BOLUS
INTRAVENOUS | Status: DC | PRN
  Administered 2017-10-27: 110 mg via INTRAVENOUS
  Administered 2017-10-27: 50 mg via INTRAVENOUS

## 2017-10-27 MED ORDER — LIDOCAINE HCL (PF) 2 % IJ SOLN
INTRAMUSCULAR | Status: AC
  Filled 2017-10-27: qty 10

## 2017-10-27 NOTE — H&P (Signed)
Outpatient short stay form Pre-procedure 10/27/2017 3:36 PM Christena DeemMartin U Debie Ashline MD  Primary Physician: Dr Jenell MillinerYvonne Luyando  Reason for visit:  EGD  History of present illness:  Patient is a 61 year old female presenting today as above. She has a personal history of a large hiatal hernia with associated Cameron erosions and iron deficiency anemia. She [facility] to possible fundoplication. However her last EGD showed multiple Cameron erosions and we were treating these and today is a follow-up evaluation. She currently denies use of any aspirin or NSAID products. She takes no blood thinning agents.    Current Facility-Administered Medications:  .  0.9 %  sodium chloride infusion, , Intravenous, Continuous, Christena DeemSkulskie, Tuck Dulworth U, MD, Last Rate: 20 mL/hr at 10/27/17 1517 .  0.9 %  sodium chloride infusion, , Intravenous, Continuous, Christena DeemSkulskie, Ilay Capshaw U, MD  Prescriptions Prior to Admission  Medication Sig Dispense Refill Last Dose  . acetaminophen (TYLENOL) 325 MG tablet Take 650 mg by mouth every 6 (six) hours as needed.   Past Month at Unknown time  . diazepam (VALIUM) 5 MG tablet Take 5 mg by mouth every 6 (six) hours as needed for anxiety.   Past Month at Unknown time  . ergocalciferol (DRISDOL) 8000 UNIT/ML drops Take 8,000 Units by mouth daily.   Past Week at Unknown time  . ferrous sulfate 325 (65 FE) MG tablet Take 325 mg by mouth daily with breakfast.   Past Week at Unknown time  . gabapentin (NEURONTIN) 100 MG capsule Take 100 mg by mouth 3 (three) times daily.   Past Week at Unknown time  . methocarbamol (ROBAXIN) 500 MG tablet Take 500 mg by mouth 4 (four) times daily.   Past Week at Unknown time  . nortriptyline (PAMELOR) 10 MG capsule Take 10 mg by mouth at bedtime.   Past Week at Unknown time  . pantoprazole (PROTONIX) 40 MG tablet Take 40 mg by mouth daily.   Past Week at Unknown time  . predniSONE (DELTASONE) 5 MG tablet Take 5 mg by mouth daily  with breakfast.   Past Week at Unknown time  . sucralfate (CARAFATE) 1 g tablet Take 1 g by mouth 4 (four) times daily -  with meals and at bedtime.   Past Week at Unknown time  . traMADol (ULTRAM) 50 MG tablet Take by mouth every 6 (six) hours as needed.   Past Week at Unknown time     Allergies  Allergen Reactions  . Egg Yolk Hives  . Influenza Virus Vaccine H5n1 Swelling  . Latex Swelling  . Sulfa Antibiotics Swelling     Past Medical History:  Diagnosis Date  . Anemia   . Fibromyalgia   . GERD (gastroesophageal reflux disease)   . Hyperglycemia   . Neck pain   . Sleep apnea   . Tingling   . Weakness     Review of systems:      Physical Exam    Heart and lungs: Regular rate and rhythm without rub or gallop, lungs are bilaterally clear.    HEENT: Normocephalic atraumatic eyes are anicteric    Other:     Pertinant exam for procedure: Soft mild discomfort palpation the epigastric region. Bowel sounds positive normoactive. There is no rebound.    Planned proceedures: EGD and indicated procedures. I have discussed the risks benefits and complications of procedures to include not limited to bleeding, infection, perforation and the risk of sedation and the patient wishes  to proceed.    Christena Deem, MD Gastroenterology 10/27/2017  3:36 PM

## 2017-10-27 NOTE — Transfer of Care (Signed)
Immediate Anesthesia Transfer of Care Note  Patient: Julia [name]  Procedure(s) Performed: ESOPHAGOGASTRODUODENOSCOPY (EGD) WITH PROPOFOL (N/A )  Patient Location: PACU and Endoscopy Unit  Anesthesia Type:General  Level of Consciousness: drowsy and patient cooperative  Airway & Oxygen Therapy: Patient Spontanous Breathing and Patient connected to nasal cannula oxygen  Post-op Assessment: Report given to RN and Post -op Vital signs reviewed and stable  Post vital signs: Reviewed and stable  Last Vitals:  Vitals:   10/27/17 1507 10/27/17 1609  BP: 132/79 (!) 104/57  Pulse: 83   Resp: 18   Temp: (!) 36.3 C (!) 36.1 C  SpO2: 100%     Last Pain:  Vitals:   10/27/17 1609  TempSrc: Tympanic         Complications: No apparent anesthesia complications

## 2017-10-27 NOTE — Anesthesia Post-op Follow-up Note (Signed)
Anesthesia QCDR form completed.        

## 2017-10-27 NOTE — Anesthesia Preprocedure Evaluation (Signed)
Anesthesia Evaluation  Patient identified by MRN, date of birth, ID band Patient awake    Reviewed: Allergy & Precautions, H&P , NPO status , Patient's Chart, lab work & pertinent test results, reviewed documented beta blocker date and time   Airway Mallampati: II   Neck ROM: full    Dental  (+) Teeth Intact   Pulmonary neg pulmonary ROS, asthma , sleep apnea , Current Smoker,    Pulmonary exam normal        Cardiovascular Exercise Tolerance: Good negative cardio ROS Normal cardiovascular exam Rhythm:regular Rate:Normal     Neuro/Psych  Neuromuscular disease negative neurological ROS  negative psych ROS   GI/Hepatic negative GI ROS, Neg liver ROS, GERD  Medicated,  Endo/Other  negative endocrine ROS  Renal/GU negative Renal ROS  negative genitourinary   Musculoskeletal   Abdominal   Peds  Hematology negative hematology ROS (+) anemia ,   Anesthesia Other Findings Past Medical History: No date: Anemia No date: Fibromyalgia No date: GERD (gastroesophageal reflux disease) No date: Hyperglycemia No date: Neck pain No date: Sleep apnea No date: Tingling No date: Weakness Past Surgical History: No date: BREAST SURGERY     Comment:  breast mass excision x 2 benign No date: CHOLECYSTECTOMY No date: DIAGNOSTIC LAPAROSCOPY WITH REMOVAL OF ECTOPIC PREGNANCY BMI    Body Mass Index:  30.48 kg/m     Reproductive/Obstetrics negative OB ROS                             Anesthesia Physical Anesthesia Plan  ASA: III  Anesthesia Plan: General   Post-op Pain Management:    Induction:   PONV Risk Score and Plan:   Airway Management Planned:   Additional Equipment:   Intra-op Plan:   Post-operative Plan:   Informed Consent: I have reviewed the patients History and Physical, chart, labs and discussed the procedure including the risks, benefits and alternatives for the proposed  anesthesia with the patient or authorized representative who has indicated his/her understanding and acceptance.   Dental Advisory Given  Plan Discussed with: CRNA  Anesthesia Plan Comments:         Anesthesia Quick Evaluation

## 2017-10-27 NOTE — Op Note (Signed)
[facility] Gastroenterology Patient Name: Julia StallionZipporah Clark Baldwin Procedure Date: 10/27/2017 3:33 PM MRN: [id number] Account #: [id number] Date of Birth: Oct 12, 1956 Admit Type: Outpatient Age: [id number] [facility] 3 Gender: Female Note Status: Finalized Procedure:            Upper GI endoscopy Indications:          Epigastric abdominal pain, Dyspepsia Providers:            Christena DeemMartin U. Skulskie, MD Medicines:            Monitored Anesthesia Care Complications:        No immediate complications. Procedure:            Pre-Anesthesia Assessment:                       - ASA Grade Assessment: III - A patient with severe                        systemic disease.                       After obtaining informed consent, the endoscope was                        passed under direct vision. Throughout the procedure,                        the patient's blood pressure, pulse, and oxygen                        saturations were monitored continuously. The patient                        tolerated the procedure well. The Endoscope was                        introduced through the mouth, and advanced to the third                        part of duodenum. Findings:      The distal esophagus was moderately tortuous.      LA Grade B (one or more mucosal breaks greater than 5 mm, not extending       between the tops of two mucosal folds) esophagitis with no bleeding was       found. Biopsies were taken with a cold forceps for histology.      A medium-sized hiatal hernia was present.      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      Patchy minimal inflammation characterized by congestion (edema) and       erythema was found in the gastric body. Biopsies were taken with a cold       forceps for histology. Biopsies were taken with a cold forceps for       Helicobacter pylori testing.      The examined duodenum was normal. Impression:           - Tortuous  esophagus.                       - LA Grade B erosive esophagitis. Biopsied.                       -  Medium-sized hiatal hernia.                       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                       - Erosive gastritis. Biopsied.                       - Normal examined duodenum. Recommendation:       - Await pathology results.                       - Use Protonix (pantoprazole) 40 mg PO BID indefinitely.                       - Use sucralfate tablets 1 gram PO QID for 3 months. Procedure Code(s):    --- Professional ---                       [phone], Esophagogastroduodenoscopy, flexible, transoral;                        with biopsy, single or multiple Diagnosis Code(s):    --- Professional ---                       Q39.9, Congenital malformation of esophagus, unspecified                       K20.8, Other esophagitis                       K44.9, Diaphragmatic hernia without obstruction or                        gangrene                       K25.9, Gastric ulcer, unspecified as acute or chronic,                        without hemorrhage or perforation                       K29.60, Other gastritis without bleeding                       R10.13, Epigastric pain CPT copyright 2016 American Medical Association. All rights reserved. The codes documented in this report are preliminary and upon coder review may  be revised to meet current compliance requirements. Christena Deem, MD 10/27/2017 4:14:50 PM This report has been signed electronically. Number of Addenda: 0 Note Initiated On: 10/27/2017 3:33 PM      [facility]

## 2017-10-28 NOTE — Anesthesia Postprocedure Evaluation (Signed)
Anesthesia Post Note  Patient: Danay Elease HashimotoClark Baldwin  Procedure(s) Performed: ESOPHAGOGASTRODUODENOSCOPY (EGD) WITH PROPOFOL (N/A )  Patient location during evaluation: PACU Anesthesia Type: General Level of consciousness: awake and alert Pain management: pain level controlled Vital Signs Assessment: post-procedure vital signs reviewed and stable Respiratory status: spontaneous breathing, nonlabored ventilation, respiratory function stable and patient connected to nasal cannula oxygen Cardiovascular status: blood pressure returned to baseline and stable Postop Assessment: no apparent nausea or vomiting Anesthetic complications: no     Last Vitals:  Vitals:   10/27/17 1629 10/27/17 1639  BP: 111/80 127/84  Pulse:    Resp:    Temp:    SpO2:      Last Pain:  Vitals:   10/27/17 1609  TempSrc: Tympanic                 Yevette EdwardsJames G Bryley Chrisman

## 2017-10-29 ENCOUNTER — Encounter: Payer: Self-pay | Admitting: Gastroenterology

## 2017-10-29 LAB — SURGICAL PATHOLOGY

## 2018-03-06 ENCOUNTER — Telehealth: Payer: Self-pay | Admitting: Pharmacy Technician

## 2018-03-06 NOTE — Telephone Encounter (Signed)
Patient failed to provide 2019 financial documentation.  No additional medication assistance will be provided by [facility] without the required proof of income documentation.  Patient notified by letter.  Lithzy Bernard J. Ashanna Heinsohn Care Manager [facility] 

## 2018-04-01 ENCOUNTER — Telehealth: Payer: Self-pay | Admitting: Pharmacy Technician

## 2018-04-01 NOTE — Telephone Encounter (Signed)
Patient approved [facility] till 11/29/18.  Patient will be eligible for Medicare Parts A, B & D.  Sherilyn DacostaBetty J. Gilbert Narain Care Manager [facility]

## 2018-05-12 ENCOUNTER — Telehealth: Payer: Self-pay | Admitting: Pharmacist

## 2018-05-12 NOTE — Telephone Encounter (Signed)
05/12/2018 8:14:24 AM - [facility] HFA  05/12/18 I have received signed [place] HFA script back from provider, holding to receive patient portion back-mailed to patient 04/23/18.Forde Radon

## 2019-01-11 ENCOUNTER — Telehealth: Payer: Self-pay | Admitting: Pharmacy Technician

## 2019-01-11 NOTE — Telephone Encounter (Signed)
Has Medicare Advantage plan with both healthcare and prescription coverage.  No longer meets [facility]'s eligibility criteria.  Patient notified.  Sherilyn Dacosta Care Manager [facility]

## 2019-04-27 ENCOUNTER — Ambulatory Visit
Admission: RE | Admit: 2019-04-27 | Payer: PRIVATE HEALTH INSURANCE | Source: Home / Self Care | Admitting: Gastroenterology

## 2019-04-27 ENCOUNTER — Encounter: Admission: RE | Payer: Self-pay | Source: Home / Self Care

## 2019-04-27 SURGERY — ESOPHAGOGASTRODUODENOSCOPY (EGD) WITH PROPOFOL
Anesthesia: General

## 2019-10-07 ENCOUNTER — Inpatient Hospital Stay
Admission: EM | Admit: 2019-10-07 | Discharge: 2019-10-12 | DRG: 378 | Disposition: A | Discharge status: Home / Self Care | Payer: Medicare Other | Attending: Internal Medicine | Admitting: Internal Medicine

## 2019-10-07 ENCOUNTER — Other Ambulatory Visit: Payer: Self-pay

## 2019-10-07 ENCOUNTER — Emergency Department: Payer: Medicare Other

## 2019-10-07 DIAGNOSIS — K319 Disease of stomach and duodenum, unspecified: Secondary | ICD-10-CM | POA: Diagnosis present

## 2019-10-07 DIAGNOSIS — Z887 Allergy status to serum and vaccine status: Secondary | ICD-10-CM

## 2019-10-07 DIAGNOSIS — K449 Diaphragmatic hernia without obstruction or gangrene: Secondary | ICD-10-CM | POA: Diagnosis present

## 2019-10-07 DIAGNOSIS — D62 Acute posthemorrhagic anemia: Secondary | ICD-10-CM | POA: Diagnosis present

## 2019-10-07 DIAGNOSIS — K922 Gastrointestinal hemorrhage, unspecified: Secondary | ICD-10-CM | POA: Diagnosis present

## 2019-10-07 DIAGNOSIS — F419 Anxiety disorder, unspecified: Secondary | ICD-10-CM | POA: Diagnosis present

## 2019-10-07 DIAGNOSIS — G4730 Sleep apnea, unspecified: Secondary | ICD-10-CM | POA: Diagnosis present

## 2019-10-07 DIAGNOSIS — K920 Hematemesis: Secondary | ICD-10-CM

## 2019-10-07 DIAGNOSIS — F329 Major depressive disorder, single episode, unspecified: Secondary | ICD-10-CM | POA: Diagnosis present

## 2019-10-07 DIAGNOSIS — K5900 Constipation, unspecified: Secondary | ICD-10-CM | POA: Diagnosis present

## 2019-10-07 DIAGNOSIS — Z79899 Other long term (current) drug therapy: Secondary | ICD-10-CM | POA: Diagnosis not present

## 2019-10-07 DIAGNOSIS — Z8673 Personal history of transient ischemic attack (TIA), and cerebral infarction without residual deficits: Secondary | ICD-10-CM

## 2019-10-07 DIAGNOSIS — Z9049 Acquired absence of other specified parts of digestive tract: Secondary | ICD-10-CM

## 2019-10-07 DIAGNOSIS — K648 Other hemorrhoids: Secondary | ICD-10-CM | POA: Diagnosis present

## 2019-10-07 DIAGNOSIS — Z882 Allergy status to sulfonamides status: Secondary | ICD-10-CM

## 2019-10-07 DIAGNOSIS — M797 Fibromyalgia: Secondary | ICD-10-CM | POA: Diagnosis present

## 2019-10-07 DIAGNOSIS — E876 Hypokalemia: Secondary | ICD-10-CM | POA: Diagnosis not present

## 2019-10-07 DIAGNOSIS — Z20828 Contact with and (suspected) exposure to other viral communicable diseases: Secondary | ICD-10-CM | POA: Diagnosis present

## 2019-10-07 DIAGNOSIS — K2100 Gastro-esophageal reflux disease with esophagitis, without bleeding: Secondary | ICD-10-CM | POA: Diagnosis present

## 2019-10-07 DIAGNOSIS — Z91012 Allergy to eggs: Secondary | ICD-10-CM

## 2019-10-07 DIAGNOSIS — K2971 Gastritis, unspecified, with bleeding: Secondary | ICD-10-CM | POA: Diagnosis present

## 2019-10-07 DIAGNOSIS — D509 Iron deficiency anemia, unspecified: Secondary | ICD-10-CM | POA: Diagnosis not present

## 2019-10-07 DIAGNOSIS — Z91040 Latex allergy status: Secondary | ICD-10-CM | POA: Diagnosis not present

## 2019-10-07 DIAGNOSIS — F17210 Nicotine dependence, cigarettes, uncomplicated: Secondary | ICD-10-CM | POA: Diagnosis present

## 2019-10-07 DIAGNOSIS — K921 Melena: Secondary | ICD-10-CM | POA: Diagnosis not present

## 2019-10-07 DIAGNOSIS — D500 Iron deficiency anemia secondary to blood loss (chronic): Secondary | ICD-10-CM | POA: Diagnosis not present

## 2019-10-07 HISTORY — DX: Cerebral infarction, unspecified: I63.9

## 2019-10-07 LAB — LIPASE, BLOOD: Lipase: 21 U/L (ref 11–51)

## 2019-10-07 LAB — CBC
HCT: 32.7 % — ABNORMAL LOW (ref 36.0–46.0)
Hemoglobin: 10.4 g/dL — ABNORMAL LOW (ref 12.0–15.0)
MCH: 25.4 pg — ABNORMAL LOW (ref 26.0–34.0)
MCHC: 31.8 g/dL (ref 30.0–36.0)
MCV: 80 fL (ref 80.0–100.0)
Platelets: 398 10*3/uL (ref 150–400)
RBC: 4.09 MIL/uL (ref 3.87–5.11)
RDW: 16.7 % — ABNORMAL HIGH (ref 11.5–15.5)
WBC: 8.9 10*3/uL (ref 4.0–10.5)
nRBC: 0 % (ref 0.0–0.2)

## 2019-10-07 LAB — COMPREHENSIVE METABOLIC PANEL
ALT: 13 U/L (ref 0–44)
AST: 16 U/L (ref 15–41)
Albumin: 3.9 g/dL (ref 3.5–5.0)
Alkaline Phosphatase: 51 U/L (ref 38–126)
Anion gap: 12 (ref 5–15)
BUN: 29 mg/dL — ABNORMAL HIGH (ref 8–23)
CO2: 19 mmol/L — ABNORMAL LOW (ref 22–32)
Calcium: 8.9 mg/dL (ref 8.9–10.3)
Chloride: 107 mmol/L (ref 98–111)
Creatinine, Ser: 0.66 mg/dL (ref 0.44–1.00)
GFR calc Af Amer: >60 mL/min (ref >60)
GFR calc non Af Amer: >60 mL/min (ref >60)
Glucose, Bld: 126 mg/dL — ABNORMAL HIGH (ref 70–99)
Potassium: 3.9 mmol/L (ref 3.5–5.1)
Sodium: 138 mmol/L (ref 135–145)
Total Bilirubin: 0.9 mg/dL (ref 0.3–1.2)
Total Protein: 7.4 g/dL (ref 6.5–8.1)

## 2019-10-07 LAB — URINALYSIS, COMPLETE (UACMP) WITH MICROSCOPIC
Bacteria, UA: NONE SEEN
Bilirubin Urine: NEGATIVE
Glucose, UA: NEGATIVE mg/dL
Hgb urine dipstick: NEGATIVE
Ketones, ur: 5 mg/dL — AB
Leukocytes,Ua: NEGATIVE
Nitrite: NEGATIVE
Protein, ur: NEGATIVE mg/dL
Specific Gravity, Urine: 1.025 (ref 1.005–1.030)
pH: 5 (ref 5.0–8.0)

## 2019-10-07 LAB — LACTIC ACID, PLASMA: Lactic Acid, Venous: 2 mmol/L (ref 0.5–1.9)

## 2019-10-07 MED ORDER — IOHEXOL 300 MG/ML  SOLN
100.0000 mL | Freq: Once | INTRAMUSCULAR | Status: AC | PRN
  Administered 2019-10-07: 22:00:00 100 mL via INTRAVENOUS

## 2019-10-07 MED ORDER — SUCRALFATE 1 G PO TABS
1.0000 g | ORAL_TABLET | Freq: Three times a day (TID) | ORAL | Status: DC
  Administered 2019-10-08 – 2019-10-12 (×11): 1 g via ORAL
  Filled 2019-10-07 (×12): qty 1

## 2019-10-07 MED ORDER — PREDNISONE 10 MG PO TABS: 5.0000 mg | ORAL_TABLET | Freq: Every day | ORAL | Status: DC

## 2019-10-07 MED ORDER — SODIUM CHLORIDE 0.9 % IV SOLN
8.0000 mg/h | INTRAVENOUS | Status: DC
  Administered 2019-10-08: 03:00:00 8 mg/h via INTRAVENOUS
  Filled 2019-10-07 (×3): qty 80

## 2019-10-07 MED ORDER — ACETAMINOPHEN 650 MG RE SUPP: 650.0000 mg | Freq: Four times a day (QID) | RECTAL | Status: DC | PRN

## 2019-10-07 MED ORDER — ACETAMINOPHEN 325 MG PO TABS
650.0000 mg | ORAL_TABLET | Freq: Four times a day (QID) | ORAL | Status: DC | PRN
  Administered 2019-10-09 – 2019-10-11 (×3): 650 mg via ORAL
  Filled 2019-10-07 (×3): qty 2

## 2019-10-07 MED ORDER — FERROUS SULFATE 325 (65 FE) MG PO TABS
325.0000 mg | ORAL_TABLET | Freq: Every day | ORAL | Status: DC
  Administered 2019-10-08 – 2019-10-09 (×2): 325 mg via ORAL
  Filled 2019-10-07 (×2): qty 1

## 2019-10-07 MED ORDER — ONDANSETRON HCL 4 MG/2ML IJ SOLN
4.0000 mg | Freq: Four times a day (QID) | INTRAMUSCULAR | Status: DC | PRN
  Administered 2019-10-09: 18:00:00 4 mg via INTRAVENOUS
  Filled 2019-10-07: qty 2

## 2019-10-07 MED ORDER — PANTOPRAZOLE SODIUM 40 MG IV SOLR: 40.0000 mg | Freq: Two times a day (BID) | INTRAVENOUS | Status: DC

## 2019-10-07 MED ORDER — METHOCARBAMOL 500 MG PO TABS: 500.0000 mg | ORAL_TABLET | Freq: Four times a day (QID) | ORAL | Status: DC

## 2019-10-07 MED ORDER — SODIUM CHLORIDE 0.9 % IV SOLN: 80.0000 mg | Freq: Once | INTRAVENOUS | Status: DC

## 2019-10-07 MED ORDER — SODIUM CHLORIDE 0.9 % IV SOLN
INTRAVENOUS | Status: DC
  Administered 2019-10-08 (×3): via INTRAVENOUS

## 2019-10-07 MED ORDER — ONDANSETRON HCL 4 MG/2ML IJ SOLN
4.0000 mg | Freq: Once | INTRAMUSCULAR | Status: AC
  Administered 2019-10-07: 22:00:00 4 mg via INTRAVENOUS
  Filled 2019-10-07: qty 2

## 2019-10-07 MED ORDER — MORPHINE SULFATE (PF) 4 MG/ML IV SOLN
4.0000 mg | Freq: Once | INTRAVENOUS | Status: AC
  Administered 2019-10-07: 4 mg via INTRAVENOUS
  Filled 2019-10-07: qty 1

## 2019-10-07 MED ORDER — ERGOCALCIFEROL 200 MCG/ML PO SOLN
8000.0000 [IU] | Freq: Every day | ORAL | Status: DC
  Filled 2019-10-07 (×5): qty 1

## 2019-10-07 MED ORDER — PANTOPRAZOLE SODIUM 40 MG IV SOLR
40.0000 mg | Freq: Once | INTRAVENOUS | Status: AC
  Administered 2019-10-07: 40 mg via INTRAVENOUS
  Filled 2019-10-07: qty 40

## 2019-10-07 MED ORDER — SODIUM CHLORIDE 0.9 % IV BOLUS
1000.0000 mL | Freq: Once | INTRAVENOUS | Status: AC
  Administered 2019-10-07: 22:00:00 1000 mL via INTRAVENOUS

## 2019-10-07 MED ORDER — DIAZEPAM 5 MG PO TABS: 5.0000 mg | ORAL_TABLET | Freq: Four times a day (QID) | ORAL | Status: DC | PRN

## 2019-10-07 MED ORDER — TRAZODONE HCL 50 MG PO TABS: 25.0000 mg | ORAL_TABLET | Freq: Every evening | ORAL | Status: DC | PRN

## 2019-10-07 MED ORDER — ONDANSETRON HCL 4 MG PO TABS: 4.0000 mg | ORAL_TABLET | Freq: Four times a day (QID) | ORAL | Status: DC | PRN

## 2019-10-07 MED ORDER — GABAPENTIN 100 MG PO CAPS
100.0000 mg | ORAL_CAPSULE | Freq: Three times a day (TID) | ORAL | Status: DC
  Filled 2019-10-07 (×5): qty 1

## 2019-10-07 NOTE — ED Triage Notes (Signed)
Pt in with co vomiting since last night states looks like coffee grounds. States hx of GI bleeds, states vomited x 5. No BM since Thursday, co left sided abd pain and states feels distended.

## 2019-10-07 NOTE — ED Provider Notes (Signed)
[facility] Emergency Department Provider Note ____________________________________________   First MD Initiated Contact with Patient 10/07/19 2112     (approximate)  I have reviewed the triage vital signs and the nursing notes.   HISTORY  Chief Complaint Emesis and Constipation    HPI Julia [name] is a 63 y.o. female with PMH as noted below as well as a history of hiatal hernia and gastritis who presents with nausea and vomiting since last night, multiple episodes, with some material that looks like coffee grounds.  She states that she has had this happen before.  In addition the patient reports upper abdominal pain that developed tonight.  She attributes this to her hiatal hernia.  She denies diarrhea and states that she has been constipated since last week.  She reports that this is not unusual for her.  She denies fever, cough, or chest pain.  She denies any unusual foods.  Past Medical History:  Diagnosis Date  . Anemia   . Fibromyalgia   . GERD (gastroesophageal reflux disease)   . Hyperglycemia   . Neck pain   . Sleep apnea   . Tingling   . Weakness     There are no active problems to display for this patient.   Past Surgical History:  Procedure Laterality Date  . BREAST SURGERY     breast mass excision x 2 benign  . CHOLECYSTECTOMY    . DIAGNOSTIC LAPAROSCOPY WITH REMOVAL OF ECTOPIC PREGNANCY    . ESOPHAGOGASTRODUODENOSCOPY (EGD) WITH PROPOFOL N/A 10/27/2017   Procedure: ESOPHAGOGASTRODUODENOSCOPY (EGD) WITH PROPOFOL;  Surgeon: Christena Deem, MD;  Location[facility] ENDOSCOPY;  Service: Endoscopy;  Laterality: N/A;    Prior to Admission medications   Medication Sig Start Date End Date Taking? Authorizing Provider  acetaminophen (TYLENOL) 325 MG tablet Take 650 mg by mouth every 6 (six) hours as needed.    [provider]  diazepam (VALIUM) 5 MG tablet Take 5 mg by mouth every 6 (six) hours as needed for anxiety.     [provider]  ergocalciferol (DRISDOL) 8000 UNIT/ML drops Take 8,000 Units by mouth daily.    [provider]  ferrous sulfate 325 (65 FE) MG tablet Take 325 mg by mouth daily with breakfast.    [provider]  gabapentin (NEURONTIN) 100 MG capsule Take 100 mg by mouth 3 (three) times daily.    [provider]  methocarbamol (ROBAXIN) 500 MG tablet Take 500 mg by mouth 4 (four) times daily.    [provider]  nortriptyline (PAMELOR) 10 MG capsule Take 10 mg by mouth at bedtime.    [provider]  pantoprazole (PROTONIX) 40 MG tablet Take 40 mg by mouth daily.    [provider]  predniSONE (DELTASONE) 5 MG tablet Take 5 mg by mouth daily with breakfast.    [provider]  sucralfate (CARAFATE) 1 g tablet Take 1 g by mouth 4 (four) times daily -  with meals and at bedtime.    [provider]  traMADol (ULTRAM) 50 MG tablet Take by mouth every 6 (six) hours as needed.    [provider]    Allergies Egg yolk, Influenza virus vaccine h5n1, Latex, and Sulfa antibiotics  No family history on file.  Social History Social History   Tobacco Use  . Smoking status: Light Tobacco Smoker    Packs/day: 0.25    Types: Cigarettes  . Smokeless tobacco: Never Used  Substance Use Topics  .  Alcohol use: Yes    Comment: social  . Drug use: No    Review of Systems  Constitutional: No fever. Eyes: No redness. ENT: No sore throat. Cardiovascular: Denies chest pain. Respiratory: Denies shortness of breath. Gastrointestinal: Positive for nausea and vomiting. Genitourinary: Negative for dysuria.  Musculoskeletal: Negative for back pain. Skin: Negative for rash. Neurological: Negative for headache.   ____________________________________________   PHYSICAL EXAM:  VITAL SIGNS: ED Triage Vitals  Enc Vitals Group     BP 10/07/19 2052 101/74     Pulse Rate 10/07/19 2052 (!) 107     Resp 10/07/19  2052 20     Temp 10/07/19 2052 98.3 F (36.8 C)     Temp Source 10/07/19 2052 Oral     SpO2 10/07/19 2052 98 %     Weight 10/07/19 2053 180 lb (81.6 kg)     Height 10/07/19 2053 5\' 5"  (1.651 m)     Head Circumference --      Peak Flow --      Pain Score 10/07/19 2052 8     Pain Loc --      Pain Edu? --      Excl. in [place]? --     Constitutional: Alert and oriented.  Uncomfortable but not acutely ill-appearing.   Eyes: Conjunctivae are normal.  No scleral icterus. Head: Atraumatic. Nose: No congestion/rhinnorhea. Mouth/Throat: Mucous membranes are slightly dry.   Neck: Normal range of motion.  Cardiovascular: Tachycardic, regular rhythm. Good peripheral circulation. Respiratory: Normal respiratory effort.  No retractions.  Gastrointestinal: Soft with mild epigastric tenderness.  No distention.  Genitourinary: No flank tenderness. Musculoskeletal: No lower extremity edema.  Extremities warm and well perfused.  Neurologic:  Normal speech and language. No gross focal neurologic deficits are appreciated.  Skin:  Skin is warm and dry. No rash noted. Psychiatric: Mood and affect are normal. Speech and behavior are normal.  ____________________________________________   LABS (all labs ordered are listed, but only abnormal results are displayed)  Labs Reviewed  CBC - Abnormal; Notable for the following components:      Result Value   Hemoglobin 10.4 (*)    HCT 32.7 (*)    MCH 25.4 (*)    RDW 16.7 (*)    All other components within normal limits  COMPREHENSIVE METABOLIC PANEL - Abnormal; Notable for the following components:   CO2 19 (*)    Glucose, Bld 126 (*)    BUN 29 (*)    All other components within normal limits  URINALYSIS, COMPLETE (UACMP) WITH MICROSCOPIC - Abnormal; Notable for the following components:   Color, Urine YELLOW (*)    APPearance HAZY (*)    Ketones, ur 5 (*)    All other components within normal limits  LACTIC ACID, PLASMA - Abnormal; Notable for the  following components:   Lactic Acid, Venous 2.0 (*)    All other components within normal limits  SARS CORONAVIRUS 2 (TAT 6-24 HRS)  LIPASE, BLOOD  LACTIC ACID, PLASMA   ____________________________________________  EKG  ED ECG REPORT I, Arta Silence, the attending physician, personally viewed and interpreted this ECG.  Date: 10/07/2019 EKG Time: 2118 Rate: 88 Rhythm: normal sinus rhythm QRS Axis: normal Intervals: Prolonged QTc ST/T Wave abnormalities: Specific ST abnormalities  Narrative Interpretation: no evidence of acute ischemia  ____________________________________________  RADIOLOGY  CT abdomen: Stable hiatal hernia with no acute abnormalities  ____________________________________________   PROCEDURES  Procedure(s) performed: No  Procedures  Critical Care performed: No ____________________________________________  INITIAL IMPRESSION / ASSESSMENT AND PLAN / ED COURSE  Pertinent labs & imaging results that were available during my care of the patient were reviewed by me and considered in my medical decision making (see chart for details).  63 year old female with PMH as noted above presents with nausea and vomiting since last night described as coffee grounds and associated with epigastric abdominal pain.  The patient reports prior history of GI bleeding and a hiatal hernia.  I reviewed the past medical records in Epic.  The patient had an endoscopy in 2018 showing hiatal hernia with Sheria Langameron ulcers and erosive gastritis.  The patient was admitted in 2016 with upper GI bleed thought to be due to NSAID use and erosive gastropathy.  On exam currently, the patient is weak but not acutely ill-appearing.  She was apparently borderline hypotensive in triage although her blood pressure is now normal.  She is slightly tachycardic.  The abdomen is soft with mild epigastric tenderness.  The remainder of the exam is unremarkable.  Overall presentation is most  consistent with gastritis exacerbation versus possible acute gastroenteritis, hepatobiliary etiology such as pancreatitis, or less likely colitis or diverticulitis.  I have a low suspicion for cardiac etiology.  The EKG shows no significant findings.  We will obtain lab work-up, CT abdomen, and reassess.  ----------------------------------------- 10:58 PM on 10/07/2019 -----------------------------------------  CT shows no acute abnormality.  The patient's hemoglobin is reassuring.  The other lab work-up is only notable for borderline elevated lactic acid although this could be from vomiting and dehydration.  The patient states she feels a bit better and has had no vomiting in the ED, but is still quite uncomfortable and nauseous.  She states that she feels weak.  Given the known GI bleed history, the borderline hypotension and tachycardia when she arrived, and the presence of coffee-ground earlier, I will admit the patient for further observation.  I ordered IV Protonix.  I signed the patient out to the hospitalist NP [place].   ________________________________  Greggory StallionZipporah Clark Baldwin was evaluated in Emergency Department on 10/07/2019 for the symptoms described in the history of present illness. She was evaluated in the context of the global COVID-19 pandemic, which necessitated consideration that the patient might be at risk for infection with the SARS-CoV-2 virus that causes COVID-19. Institutional protocols and algorithms that pertain to the evaluation of patients at risk for COVID-19 are in a state of rapid change based on information released by regulatory bodies including the CDC and federal and state organizations. These policies and algorithms were followed during the patient's care in the ED. ____________________________________________   FINAL CLINICAL IMPRESSION(S) / ED DIAGNOSES  Final diagnoses:  Coffee ground emesis      NEW MEDICATIONS STARTED DURING THIS VISIT:  New  Prescriptions   No medications on file     Note:  This document was prepared using Dragon voice recognition software and may include unintentional dictation errors.    Dionne BucySiadecki, Akiya Morr, MD 10/07/19 2259

## 2019-10-07 NOTE — ED Notes (Signed)
Patient transported to CT 

## 2019-10-08 ENCOUNTER — Other Ambulatory Visit: Payer: Self-pay

## 2019-10-08 LAB — COMPREHENSIVE METABOLIC PANEL
ALT: 12 U/L (ref 0–44)
AST: 14 U/L — ABNORMAL LOW (ref 15–41)
Albumin: 3.3 g/dL — ABNORMAL LOW (ref 3.5–5.0)
Alkaline Phosphatase: 46 U/L (ref 38–126)
Anion gap: 6 (ref 5–15)
BUN: 22 mg/dL (ref 8–23)
CO2: 21 mmol/L — ABNORMAL LOW (ref 22–32)
Calcium: 8.5 mg/dL — ABNORMAL LOW (ref 8.9–10.3)
Chloride: 110 mmol/L (ref 98–111)
Creatinine, Ser: 0.55 mg/dL (ref 0.44–1.00)
GFR calc Af Amer: >60 mL/min (ref >60)
GFR calc non Af Amer: >60 mL/min (ref >60)
Glucose, Bld: 113 mg/dL — ABNORMAL HIGH (ref 70–99)
Potassium: 3.9 mmol/L (ref 3.5–5.1)
Sodium: 137 mmol/L (ref 135–145)
Total Bilirubin: 0.7 mg/dL (ref 0.3–1.2)
Total Protein: 6.3 g/dL — ABNORMAL LOW (ref 6.5–8.1)

## 2019-10-08 LAB — FOLATE: Folate: 6.2 ng/mL (ref >5.9)

## 2019-10-08 LAB — IRON AND TIBC
Iron: 275 ug/dL — ABNORMAL HIGH (ref 28–170)
Saturation Ratios: 88 % — ABNORMAL HIGH (ref 10.4–31.8)
TIBC: 314 ug/dL (ref 250–450)
UIBC: 39 ug/dL

## 2019-10-08 LAB — HEMOGLOBIN AND HEMATOCRIT, BLOOD
HCT: 29.1 % — ABNORMAL LOW (ref 36.0–46.0)
HCT: 27.7 % — ABNORMAL LOW (ref 36.0–46.0)
HCT: 26.7 % — ABNORMAL LOW (ref 36.0–46.0)
HCT: 29.4 % — ABNORMAL LOW (ref 36.0–46.0)
Hemoglobin: 9.2 g/dL — ABNORMAL LOW (ref 12.0–15.0)
Hemoglobin: 8.9 g/dL — ABNORMAL LOW (ref 12.0–15.0)
Hemoglobin: 8.4 g/dL — ABNORMAL LOW (ref 12.0–15.0)
Hemoglobin: 8.7 g/dL — ABNORMAL LOW (ref 12.0–15.0)

## 2019-10-08 LAB — APTT: aPTT: 29 seconds (ref 24–36)

## 2019-10-08 LAB — PROTIME-INR
INR: 1 (ref 0.8–1.2)
Prothrombin Time: 13.5 seconds (ref 11.4–15.2)

## 2019-10-08 LAB — FERRITIN: Ferritin: 5 ng/mL — ABNORMAL LOW (ref 11–307)

## 2019-10-08 LAB — LACTIC ACID, PLASMA: Lactic Acid, Venous: 1 mmol/L (ref 0.5–1.9)

## 2019-10-08 LAB — HIV ANTIBODY (ROUTINE TESTING W REFLEX): HIV Screen 4th Generation wRfx: NONREACTIVE

## 2019-10-08 LAB — VITAMIN B12: Vitamin B-12: 343 pg/mL (ref 180–914)

## 2019-10-08 LAB — SARS CORONAVIRUS 2 (TAT 6-24 HRS): SARS Coronavirus 2: NEGATIVE

## 2019-10-08 MED ORDER — SODIUM CHLORIDE 0.9 % IV SOLN
INTRAVENOUS | Status: DC
  Administered 2019-10-11: 13:00:00 1000 mL via INTRAVENOUS

## 2019-10-08 MED ORDER — PEG 3350-KCL-NA BICARB-NACL 420 G PO SOLR
4000.0000 mL | Freq: Once | ORAL | Status: AC
  Administered 2019-10-09: 4000 mL via ORAL
  Filled 2019-10-08: qty 4000

## 2019-10-08 MED ORDER — SENNOSIDES-DOCUSATE SODIUM 8.6-50 MG PO TABS
1.0000 | ORAL_TABLET | Freq: Two times a day (BID) | ORAL | Status: DC
  Administered 2019-10-08 – 2019-10-09 (×2): 1 via ORAL
  Filled 2019-10-08 (×2): qty 1

## 2019-10-08 MED ORDER — PANTOPRAZOLE SODIUM 40 MG IV SOLR
40.0000 mg | Freq: Once | INTRAVENOUS | Status: DC
  Filled 2019-10-08: qty 40

## 2019-10-08 MED ORDER — SODIUM CHLORIDE 0.9 % IV SOLN
8.0000 mg/h | INTRAVENOUS | Status: DC
  Administered 2019-10-08 – 2019-10-09 (×4): 8 mg/h via INTRAVENOUS
  Filled 2019-10-08 (×5): qty 80

## 2019-10-08 NOTE — Consult Note (Signed)
Jonathon Bellows , MD [address] [place], [place], [place], [address] [address], [place], [place], [place], [address] Phone: [phone]  Fax: [phone]  Consultation  Referring Provider:    Dr. Darvin Neighbours primary Care Physician:  Patient, No Pcp Per Primary Gastroenterologist:  Dr. Gustavo Lah         Reason for Consultation:   GI bleed  Date of Admission:  10/07/2019 Date of Consultation:  10/08/2019         HPI:   Julia [name] is a 63 y.o. female is a patient of Dr. Gustavo Lah and follows that can order clinic.  Last office visit in February 2020.  At that point of time she was having dark stools.  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  History of missing follow-up appointments.  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  She was advised to get her hiatal hernia repaired in the past but did not proceed with doing so.  Last colonoscopy was in March 2016 with 3 hyperplastic polyps resected.  In February the plan was to perform an EGD but appears that the patient called and canceled subsequently.  She was admitted on 10/08/2019 with intractable nausea, vomiting with coffee-ground emesis.  In the ER she had a CT scan of the abdomen for abdominal pain which showed no acute intra-abdominal process and showed a large hiatal hernia containing the majority of the stomach.  Enlarged bulky fibroid uterus.  Hemoglobin on admission was 10.4 g with an MCV of 80.  In fact her hemoglobin was 12.4 g in February 2020.  In 2017 she had a hemoglobin 9.4 g with an MCV of 67.  No iron studies checked on admission.  Urine analysis shows no blood in the urine.   She says she has had black tarry stools on and off for a few weeks, has been taking Allves daily for a few weeks,s he has had colon polyps and is due for a colonoscopy soon. Denies any abdominal pain presently.   Not had a bowel movement since  coming into the hospital ./ Past Medical History:  Diagnosis Date  . Anemia   . Fibromyalgia   . GERD (gastroesophageal reflux disease)   . Hyperglycemia   . Neck pain   . Sleep apnea   . Tingling   . Weakness     Past Surgical History:  Procedure Laterality Date  . BREAST SURGERY     breast mass excision x 2 benign  . CHOLECYSTECTOMY    . DIAGNOSTIC LAPAROSCOPY WITH REMOVAL OF ECTOPIC PREGNANCY    . ESOPHAGOGASTRODUODENOSCOPY (EGD) WITH PROPOFOL N/A 10/27/2017   Procedure: ESOPHAGOGASTRODUODENOSCOPY (EGD) WITH PROPOFOL;  Surgeon: Lollie Sails, MD;  Location[facility] ENDOSCOPY;  Service: Endoscopy;  Laterality: N/A;    Prior to Admission medications   Medication Sig Start Date End Date Taking? Authorizing Provider  acetaminophen (TYLENOL) 325 MG tablet Take 650 mg by mouth every 6 (six) hours as needed.   Yes [provider]  albuterol (VENTOLIN HFA) 108 (90 Base) MCG/ACT inhaler Inhale 2 puffs into the lungs every 6 (six) hours as needed for wheezing. 04/21/18 06/07/20 Yes [provider]  diazepam (VALIUM) 5 MG tablet Take 5 mg by mouth every 6 (six) hours as needed for anxiety.   Yes [provider]  DULoxetine (CYMBALTA) 60 MG capsule Take 1 capsule by mouth daily. 04/21/18 06/07/20 Yes [provider]  iron polysaccharides (FERREX 150) 150 MG capsule Take 1 capsule by mouth daily. 06/28/19 06/27/20 Yes [provider]  methocarbamol (ROBAXIN) 500 MG tablet Take 500 mg by mouth 4 (four) times daily.   Yes [provider]  nortriptyline (PAMELOR) 10 MG capsule Take 10 mg by mouth at bedtime.   Yes [provider]  pantoprazole (PROTONIX) 40 MG tablet Take 40 mg by mouth 2 (two) times daily.    Yes [provider]  sucralfate (CARAFATE) 1 g tablet Take 1 g by mouth 4 (four) times daily -  with meals and at bedtime.   Yes [provider]  traMADol (ULTRAM) 50 MG tablet Take by mouth every 6 (six) hours as  needed.   Yes [provider]  ergocalciferol (DRISDOL) 8000 UNIT/ML drops Take 8,000 Units by mouth daily.    [provider]  ferrous sulfate 325 (65 FE) MG tablet Take 325 mg by mouth daily with breakfast.    [provider]  gabapentin (NEURONTIN) 100 MG capsule Take 100 mg by mouth 3 (three) times daily.    [provider]  predniSONE (DELTASONE) 5 MG tablet Take 5 mg by mouth daily with breakfast.    [provider]    History reviewed. No pertinent family history.   Social History   Tobacco Use  . Smoking status: Light Tobacco Smoker    Packs/day: 0.25    Types: Cigarettes  . Smokeless tobacco: Never Used  Substance Use Topics  . Alcohol use: Yes    Comment: social  . Drug use: No    Allergies as of 10/07/2019 - Review Complete 10/07/2019  Allergen Reaction Noted  . Egg yolk Hives 02/10/2017  . Influenza virus vaccine h5n1 Swelling 02/10/2017  . Latex Swelling 02/10/2017  . Sulfa antibiotics Swelling 02/10/2017    Review of Systems:    All systems reviewed and negative except where noted in HPI.   Physical Exam:  Vital signs in last 24 hours: Temp:  [98.2 F (36.8 C)-98.3 F (36.8 C)] 98.2 F (36.8 C) (10/09 0826) Pulse Rate:  [64-107] 64 (10/09 0826) Resp:  [15-20] 20 (10/09 0100) BP: (101-118)/(64-74) 118/64 (10/09 0826) SpO2:  [98 %-100 %] 100 % (10/09 0826) Weight:  [81.6 kg] 81.6 kg (10/08 2053) Last BM Date: 10/08/19 General:   Pleasant, cooperative in NAD Head:  Normocephalic and atraumatic. Eyes:   No icterus.   Conjunctiva pink. PERRLA. Ears:  Normal auditory acuity. Neck:  Supple; no masses or thyroidomegaly Lungs: Respirations even and unlabored. Lungs clear to auscultation bilaterally.   No wheezes, crackles, or rhonchi.  Heart:  Regular rate and rhythm;  Without murmur, clicks, rubs or gallops Abdomen:  Soft, nondistended, nontender. Normal bowel sounds. No appreciable masses or hepatomegaly.  No  rebound or guarding.  Neurologic:  Alert and oriented x3;  grossly normal neurologically. Skin:  Intact without significant lesions or rashes. Cervical Nodes:  No significant cervical adenopathy. Psych:  Alert and cooperative. Normal affect.  LAB RESULTS: Recent Labs    10/07/19 2056 10/08/19 0227 10/08/19 0840  WBC 8.9  --   --   HGB 10.4* 9.2* 8.9*  HCT 32.7* 29.1* 27.7*  PLT 398  --   --    BMET Recent Labs    10/07/19 2056 10/08/19 0227  NA 138 137  K 3.9 3.9  CL 107 110  CO2 19* 21*  GLUCOSE 126* 113*  BUN 29* 22  CREATININE 0.66 0.55  CALCIUM 8.9  8.5*   LFT Recent Labs    10/08/19 0227  PROT 6.3*  ALBUMIN 3.3*  AST 14*  ALT 12  ALKPHOS 46  BILITOT 0.7   PT/INR Recent Labs    10/08/19 0227  LABPROT 13.5  INR 1.0    STUDIES: Ct Abdomen Pelvis W Contrast  Result Date: 10/07/2019 CLINICAL DATA:  Coffee ground hematemesis since last night. History of GI bleeds. EXAM: CT ABDOMEN AND PELVIS WITH CONTRAST TECHNIQUE: Multidetector CT imaging of the abdomen and pelvis was performed using the standard protocol following bolus administration of intravenous contrast. CONTRAST:  100mL OMNIPAQUE IOHEXOL 300 MG/ML  SOLN COMPARISON:  CT abdomen pelvis dated December 29, 2014. FINDINGS: Lower chest: No acute abnormality.  Unchanged large hiatal hernia. Hepatobiliary: Unchanged 4.7 cm simple cyst in the left hepatic lobe. A few other scattered subcentimeter low-density lesions within the liver remain too small to characterize but are also likely cysts. Status post cholecystectomy. No biliary dilatation. Pancreas: Unremarkable. No pancreatic ductal dilatation or surrounding inflammatory changes. Spleen: Normal in size without focal abnormality. Adrenals/Urinary Tract: The adrenal glands are unremarkable. Unchanged 1.1 cm simple cyst in the lower pole of the left kidney. No renal calculi or hydronephrosis. The bladder is largely decompressed. Stomach/Bowel: Large hiatal hernia  containing the majority of the stomach. No bowel wall thickening, distention, or surrounding inflammatory changes. Normal appendix. Vascular/Lymphatic: No significant vascular findings are present. No enlarged abdominal or pelvic lymph nodes. Reproductive: Enlarged, bulky fibroid uterus again noted with progressive calcification of a fibroid at the fundus. No adnexal mass. Other: No abdominal wall hernia or abnormality. No abdominopelvic ascites. No pneumoperitoneum. Musculoskeletal: No acute or significant osseous findings. IMPRESSION: 1.  No acute intra-abdominal process. 2. Unchanged large hiatal hernia containing the majority of the stomach. 3. Enlarged, bulky fibroid uterus, similar to prior study. Electronically Signed   By: Obie DredgeWilliam T Derry M.D.   On: 10/07/2019 21:56      Impression / Plan:   Marthe Elease HashimotoClark Baldwin is a 63 y.o. y/o female with known history of acid reflux large hiatal hernia with esophagitis.  Previously plan was for surgery to treat the hiatal hernia but patient did not go through with it.  She presents to the hospital with abdominal pain and dark tarry stools.  Hemoglobin is at baseline.  She does suffer from a microcytic anemia in the past unclear the degree of work-up that has been performed.    Impression: Likely dark tarry stools related to large hiatal hernia probably bleeding Cameron ulcers or esophagitis.Iron studies show iron deficiency anemia   Plan 1.   Check B12 and folate.Suggest IV iron  2.  High-dose PPI which he needs to take long-term. 3.  EGD+colonoscopy once covid test is negative 4.  Very likely will need surgical correction of the hernia which was seen previously. 5. No role for stool occult testing.  It is a test developed for colon cancer screening and not for detecting a GI bleed.  Does not change outcome of management 6. Stop all NSAID use.  7. Will plan for procedures on Sunday - hopefully should have COVID test by then .   I have discussed  alternative options, risks & benefits,  which include, but are not limited to, bleeding, infection, perforation,respiratory complication & drug reaction.  The patient agrees with this plan & written consent will be obtained.     Thank you for involving me in the care of this patient.      LOS: 1 day  Wyline Mood, MD  10/08/2019, 9:47 AM

## 2019-10-08 NOTE — ED Notes (Addendum)
ED TO INPATIENT HANDOFF REPORT  ED Nurse Name and Phone #:  Gershon Mussel RN  [phone]  S Name/Age/Gender Julia [name] 63 y.o. female Room/Bed: ED06A/ED06A  Code Status   Code Status: Full Code  Home/SNF/Other Home Patient oriented to: self, place, time and situation Is this baseline? Yes   Triage Complete: Triage complete  Chief Complaint Vomiting  Triage Note Pt in with co vomiting since last night states looks like coffee grounds. States hx of GI bleeds, states vomited x 5. No BM since Thursday, co left sided abd pain and states feels distended.    Allergies Allergies  Allergen Reactions  . Egg Yolk Hives  . Influenza Virus Vaccine H5n1 Swelling  . Latex Swelling  . Sulfa Antibiotics Swelling    Level of Care/Admitting Diagnosis ED Disposition    ED Disposition Condition [facility] Area: [place] [[id number]]  Level of Care: Med-Surg [16]  Covid Evaluation: Asymptomatic Screening Protocol (No Symptoms)  Diagnosis: GI bleeding [[id number]]  Admitting Physician: Christel Mormon [[id number]]  Attending Physician: Christel Mormon [[id number]]  Estimated length of stay: past midnight tomorrow  Certification:: I certify this patient will need inpatient services for at least 2 midnights  PT Class (Do Not Modify): Inpatient [101]  PT Acc Code (Do Not Modify): Private [1]       B Medical/Surgery History Past Medical History:  Diagnosis Date  . Anemia   . Fibromyalgia   . GERD (gastroesophageal reflux disease)   . Hyperglycemia   . Neck pain   . Sleep apnea   . Tingling   . Weakness    Past Surgical History:  Procedure Laterality Date  . BREAST SURGERY     breast mass excision x 2 benign  . CHOLECYSTECTOMY    . DIAGNOSTIC LAPAROSCOPY WITH REMOVAL OF ECTOPIC PREGNANCY    . ESOPHAGOGASTRODUODENOSCOPY (EGD) WITH PROPOFOL N/A 10/27/2017   Procedure: ESOPHAGOGASTRODUODENOSCOPY (EGD) WITH PROPOFOL;  Surgeon: Lollie Sails, MD;   Location: [facility] ENDOSCOPY;  Service: Endoscopy;  Laterality: N/A;     A IV Location/Drains/Wounds Patient Lines/Drains/Airways Status   Active Line/Drains/Airways    Name:   Placement date:   Placement time:   Site:   Days:   Peripheral IV 10/07/19 Right Wrist   10/07/19    2056    Wrist   1          Intake/Output Last 24 hours  Intake/Output Summary (Last 24 hours) at 10/08/2019 0117 Last data filed at 10/07/2019 2327 Gross per 24 hour  Intake 1000 ml  Output -  Net 1000 ml    Labs/Imaging Results for orders placed or performed during the hospital encounter of 10/07/19 (from the past 48 hour(s))  CBC     Status: Abnormal   Collection Time: 10/07/19  8:56 PM  Result Value Ref Range   WBC 8.9 4.0 - 10.5 K/uL   RBC 4.09 3.87 - 5.11 MIL/uL   Hemoglobin 10.4 (L) 12.0 - 15.0 g/dL   HCT 32.7 (L) 36.0 - 46.0 %   MCV 80.0 80.0 - 100.0 fL   MCH 25.4 (L) 26.0 - 34.0 pg   MCHC 31.8 30.0 - 36.0 g/dL   RDW 16.7 (H) 11.5 - 15.5 %   Platelets 398 150 - 400 K/uL   nRBC 0.0 0.0 - 0.2 %    Comment: Performed at [facility], [address], [place], [place] [address]  Comprehensive metabolic panel     Status: Abnormal  Collection Time: 10/07/19  8:56 PM  Result Value Ref Range   Sodium 138 135 - 145 mmol/L   Potassium 3.9 3.5 - 5.1 mmol/L   Chloride 107 98 - 111 mmol/L   CO2 19 (L) 22 - 32 mmol/L   Glucose, Bld 126 (H) 70 - 99 mg/dL   BUN 29 (H) 8 - 23 mg/dL   Creatinine, Ser 1.61 0.44 - 1.00 mg/dL   Calcium 8.9 8.9 - 09.6 mg/dL   Total Protein 7.4 6.5 - 8.1 g/dL   Albumin 3.9 3.5 - 5.0 g/dL   AST 16 15 - 41 U/L   ALT 13 0 - 44 U/L   Alkaline Phosphatase 51 38 - 126 U/L   Total Bilirubin 0.9 0.3 - 1.2 mg/dL   GFR calc non Af Amer >60 >60 mL/min   GFR calc Af Amer >60 >60 mL/min   Anion gap 12 5 - 15    Comment: Performed at [facility], [address] [place], [place] [address]  Lipase, blood     Status: None   Collection Time: 10/07/19  8:56 PM   Result Value Ref Range   Lipase 21 11 - 51 U/L    Comment: Performed at [facility], [address] [place], [place] [address]  Urinalysis, Complete w Microscopic     Status: Abnormal   Collection Time: 10/07/19  8:56 PM  Result Value Ref Range   Color, Urine YELLOW (A) YELLOW   APPearance HAZY (A) CLEAR   Specific Gravity, Urine 1.025 1.005 - 1.030   pH 5.0 5.0 - 8.0   Glucose, UA NEGATIVE NEGATIVE mg/dL   Hgb urine dipstick NEGATIVE NEGATIVE   Bilirubin Urine NEGATIVE NEGATIVE   Ketones, ur 5 (A) NEGATIVE mg/dL   Protein, ur NEGATIVE NEGATIVE mg/dL   Nitrite NEGATIVE NEGATIVE   Leukocytes,Ua NEGATIVE NEGATIVE   RBC / HPF 0-5 0 - 5 RBC/hpf   WBC, UA 0-5 0 - 5 WBC/hpf   Bacteria, UA NONE SEEN NONE SEEN   Squamous Epithelial / LPF 6-10 0 - 5   Mucus PRESENT     Comment: Performed at [facility], [address] [place], [place] [address]  Lactic acid, plasma     Status: Abnormal   Collection Time: 10/07/19 10:09 PM  Result Value Ref Range   Lactic Acid, Venous 2.0 (HH) 0.5 - 1.9 mmol/L    Comment: CRITICAL RESULT CALLED TO, READ BACK BY AND VERIFIED WITH TOM Yunuen Mordan AT 2252 ON 10/07/19 RWW Performed at [facility], [address] [place], [place] [address]    Ct Abdomen Pelvis W Contrast  Result Date: 10/07/2019 CLINICAL DATA:  Coffee ground hematemesis since last night. History of GI bleeds. EXAM: CT ABDOMEN AND PELVIS WITH CONTRAST TECHNIQUE: Multidetector CT imaging of the abdomen and pelvis was performed using the standard protocol following bolus administration of intravenous contrast. CONTRAST:  OMNIPAQUE IOHEXOL 300 MG/ML  SOLN COMPARISON:  CT abdomen pelvis dated December 29, 2014. FINDINGS: Lower chest: No acute abnormality.  Unchanged large hiatal hernia. Hepatobiliary: Unchanged 4.7 cm simple cyst in the left hepatic lobe. A few other scattered subcentimeter low-density lesions within the liver remain too small to characterize but are  also likely cysts. Status post cholecystectomy. No biliary dilatation. Pancreas: Unremarkable. No pancreatic ductal dilatation or surrounding inflammatory changes. Spleen: Normal in size without focal abnormality. Adrenals/Urinary Tract: The adrenal glands are unremarkable. Unchanged 1.1 cm simple cyst in the lower pole of the left kidney.  No renal calculi or hydronephrosis. The bladder is largely decompressed. Stomach/Bowel: Large hiatal hernia containing the majority of the stomach. No bowel wall thickening, distention, or surrounding inflammatory changes. Normal appendix. Vascular/Lymphatic: No significant vascular findings are present. No enlarged abdominal or pelvic lymph nodes. Reproductive: Enlarged, bulky fibroid uterus again noted with progressive calcification of a fibroid at the fundus. No adnexal mass. Other: No abdominal wall hernia or abnormality. No abdominopelvic ascites. No pneumoperitoneum. Musculoskeletal: No acute or significant osseous findings. IMPRESSION: 1.  No acute intra-abdominal process. 2. Unchanged large hiatal hernia containing the majority of the stomach. 3. Enlarged, bulky fibroid uterus, similar to prior study. Electronically Signed   By: Obie Dredge M.D.   On: 10/07/2019 21:56    Pending Labs Unresulted Labs (From admission, onward)    Start     Ordered   10/08/19 0500  Comprehensive metabolic panel  Tomorrow morning,   STAT     10/07/19 2359   10/08/19 0103  Type and screen [facility]  Once,   STAT    Comments: [facility]    10/08/19 0102   10/07/19 2359  Hemoglobin and hematocrit, blood  Now then every 6 hours,   STAT     10/07/19 2359   10/07/19 2358  APTT  Once,   STAT     10/07/19 2359   10/07/19 2358  Protime-INR  Once,   STAT     10/07/19 2359   10/07/19 2358  Occult blood card to lab, stool  Once,   STAT     10/07/19 2359   10/07/19 2352  HIV Antibody (routine testing w rflx)  (HIV Antibody (Routine testing  w reflex) panel)  Once,   STAT     10/07/19 2359   10/07/19 2352  HIV4GL Save Tube  (HIV Antibody (Routine testing w reflex) panel)  Once,   STAT     10/07/19 2359   10/07/19 2224  SARS CORONAVIRUS 2 (TAT 6-24 HRS) Nasopharyngeal Nasopharyngeal Swab  (Asymptomatic/Tier 2 Patients Labs)  Once,   STAT    Question Answer Comment  Is this test for diagnosis or screening Screening   Symptomatic for COVID-19 as defined by CDC No   Hospitalized for COVID-19 No   Admitted to ICU for COVID-19 No   Previously tested for COVID-19 No   Resident in a congregate (group) care setting No   Employed in healthcare setting No   Pregnant No      10/07/19 2223   10/07/19 2123  Lactic acid, plasma  Now then every 2 hours,   STAT     10/07/19 2123          Vitals/Pain Today's Vitals   10/07/19 2358 10/08/19 0000 10/08/19 0030 10/08/19 0100  BP: 114/70 113/69 106/67 108/73  Pulse: 83 81 79 85  Resp: 18 19 15 20   Temp:      TempSrc:      SpO2: 100% 100% 100% 100%  Weight:      Height:      PainSc: 9        Isolation Precautions No active isolations  Medications Medications  diazepam (VALIUM) tablet 5 mg (has no administration in time range)  predniSONE (DELTASONE) tablet 5 mg (has no administration in time range)  sucralfate (CARAFATE) tablet 1 g (has no administration in time range)  ferrous sulfate tablet 325 mg (has no administration in time range)  gabapentin (NEURONTIN) capsule 100 mg (has no administration in time range)  methocarbamol (ROBAXIN) tablet 500 mg (has no administration in time range)  ergocalciferol (DRISDOL) 8000 UNIT/ML drops 8,000 Units (has no administration in time range)  0.9 %  sodium chloride infusion (has no administration in time range)  acetaminophen (TYLENOL) tablet 650 mg (has no administration in time range)    Or  acetaminophen (TYLENOL) suppository 650 mg (has no administration in time range)  traZODone (DESYREL) tablet 25 mg (has no administration in  time range)  ondansetron (ZOFRAN) tablet 4 mg (has no administration in time range)    Or  ondansetron (ZOFRAN) injection 4 mg (has no administration in time range)  pantoprazole (PROTONIX) 80 mg in sodium chloride 0.9 % 250 mL (0.32 mg/mL) infusion (has no administration in time range)  pantoprazole (PROTONIX) injection 40 mg (has no administration in time range)  pantoprazole (PROTONIX) injection 40 mg (has no administration in time range)  sodium chloride 0.9 % bolus 1,000 mL (0 mLs Intravenous Stopped 10/07/19 2327)  ondansetron (ZOFRAN) injection 4 mg (4 mg Intravenous Given 10/07/19 2211)  iohexol (OMNIPAQUE) 300 MG/ML solution 100 mL (100 mLs Intravenous Contrast Given 10/07/19 2133)  pantoprazole (PROTONIX) injection 40 mg (40 mg Intravenous Given 10/07/19 2356)  morphine 4 MG/ML injection 4 mg (4 mg Intravenous Given 10/07/19 2357)    Mobility walks Low fall risk   Focused Assessments Cardiac Assessment Handoff:    Lab Results  Component Value Date   CKTOTAL 57 07/07/2013   CKMB < 0.5 (L) 07/07/2013   TROPONINI < 0.02 12/28/2014   No results found for: DDIMER Does the Patient currently have chest pain? No     R Recommendations: See Admitting Provider Note  Report given to: Nicki Guadalajararicia RN on 1A  Additional Notes:

## 2019-10-08 NOTE — Progress Notes (Signed)
Advance care planning  Purpose of Encounter GI bleed  Parties in Attendance Patient  Patients Decisional capacity Alert and oriented.  Able to make medical decisions.  Her HCPOA is Son - Mims,Decarlos  Discussed in detail regarding GI bleed.  Treatment plan , prognosis discussed.  All questions answered  CODE STATUS discussed and patient wishes to have aggressive medical care with CPR/defibrillation/intubation if needed  Orders and CODE STATUS changed  FULL CODE  Time spent - 17 minutes

## 2019-10-08 NOTE — Plan of Care (Signed)
Pt admitted to unit for "GI Bleed". Pt reports no nausea/vomiting. Pt alert and oriented. Ambulates well.

## 2019-10-08 NOTE — H&P (Addendum)
[facility] [place] [facility]   PATIENT NAME: Julia [name]    MR#:  [id number]  DATE OF BIRTH:  1956-03-07  DATE OF ADMISSION:  10/07/2019  PRIMARY CARE PHYSICIAN: Patient, No Pcp Per   REQUESTING/REFERRING PHYSICIAN:    CHIEF COMPLAINT:   Chief Complaint  Patient presents with  . Emesis  . Constipation    HISTORY OF PRESENT ILLNESS:  [name]  is a 63 y.o. African-American female with a known history of chronic anemia, fibromyalgia, and anxiety, GERD and sleep apnea, who presented emergency room with acute onset of intractable nausea and vomiting since last night with coffee-ground emesis without bilious vomitus.  She has been having epigastric and left upper quadrant abdominal pain with melena melena without bright red blood per rectum.  She admitted to headache without dizziness or blurred vision.  No chest pain or dyspnea or cough or wheezing.  No recent exposure to COVID-19.  No other bleeding diathesis.  No dysuria, oliguria or frequency or urgency or flank pain.  Upon presentation to the emergency room, heart rate was 107 with otherwise normal vital signs labs revealed hemoglobin of 10.4 hematocrit 32.7 with lactic acid of 2, a CO2 of 19 and glucose of 126 with a BUN of 29 and creatinine 0.66.  Serum lipase was normal at 21.  UA was unremarkable.  Abdominal pelvic CT scan revealed: 1.  No acute intra-abdominal process. 2. Unchanged large hiatal hernia containing the majority of the stomach. 3. Enlarged, bulky fibroid uterus, similar to prior study.  The patient was given 4 mg of IV morphine sulfate and 4 mg of IV Zofran, 40 mg of IV Protonix and 1 L bolus of IV normal saline.  She will be admitted to a medically monitored bed for further evaluation and management.  Past Medical History:  Diagnosis Date  . Anemia   . Fibromyalgia   . GERD (gastroesophageal reflux disease)   . Hyperglycemia   . Neck pain   . Sleep apnea   .  Tingling   . Weakness   Anxiety with depression History of CVA with resolved right-sided hemiparesis   PAST SURGICAL HISTORY:   Past Surgical History:  Procedure Laterality Date  . BREAST SURGERY     breast mass excision x 2 benign  . CHOLECYSTECTOMY    . DIAGNOSTIC LAPAROSCOPY WITH REMOVAL OF ECTOPIC PREGNANCY    . ESOPHAGOGASTRODUODENOSCOPY (EGD) WITH PROPOFOL N/A 10/27/2017   Procedure: ESOPHAGOGASTRODUODENOSCOPY (EGD) WITH PROPOFOL;  Surgeon: Christena Deem, MD;  Location: [facility] ENDOSCOPY;  Service: Endoscopy;  Laterality: N/A;    SOCIAL HISTORY:   Social History   Tobacco Use  . Smoking status: Light Tobacco Smoker    Packs/day: 0.25    Types: Cigarettes  . Smokeless tobacco: Never Used  Substance Use Topics  . Alcohol use: Yes    Comment: social    FAMILY HISTORY:  Positive for diabetes mellitus, hypertension and cancer.  DRUG ALLERGIES:   Allergies  Allergen Reactions  . Egg Yolk Hives  . Influenza Virus Vaccine H5n1 Swelling  . Latex Swelling  . Sulfa Antibiotics Swelling    REVIEW OF SYSTEMS:   ROS As per history of present illness. All pertinent systems were reviewed above. Constitutional,  HEENT, cardiovascular, respiratory, GI, GU, musculoskeletal, neuro, psychiatric, endocrine,  integumentary and hematologic systems were reviewed and are otherwise  negative/unremarkable except for positive findings mentioned above in the HPI.   MEDICATIONS AT HOME:   Prior to Admission  medications   Medication Sig Start Date End Date Taking? Authorizing Provider  acetaminophen (TYLENOL) 325 MG tablet Take 650 mg by mouth every 6 (six) hours as needed.    [provider]  diazepam (VALIUM) 5 MG tablet Take 5 mg by mouth every 6 (six) hours as needed for anxiety.    [provider]  ergocalciferol (DRISDOL) 8000 UNIT/ML drops Take 8,000 Units by mouth daily.    [provider]  ferrous sulfate 325 (65 FE) MG tablet Take 325 mg by  mouth daily with breakfast.    [provider]  gabapentin (NEURONTIN) 100 MG capsule Take 100 mg by mouth 3 (three) times daily.    [provider]  methocarbamol (ROBAXIN) 500 MG tablet Take 500 mg by mouth 4 (four) times daily.    [provider]  nortriptyline (PAMELOR) 10 MG capsule Take 10 mg by mouth at bedtime.    [provider]  pantoprazole (PROTONIX) 40 MG tablet Take 40 mg by mouth daily.    [provider]  predniSONE (DELTASONE) 5 MG tablet Take 5 mg by mouth daily with breakfast.    [provider]  sucralfate (CARAFATE) 1 g tablet Take 1 g by mouth 4 (four) times daily -  with meals and at bedtime.    [provider]  traMADol (ULTRAM) 50 MG tablet Take by mouth every 6 (six) hours as needed.    [provider]      VITAL SIGNS:  Blood pressure 114/70, pulse 83, temperature 98.3 F (36.8 C), temperature source Oral, resp. rate 18, height 5\' 5"  (1.651 m), weight 81.6 kg, last menstrual period 10/27/2012, SpO2 100 %.  PHYSICAL EXAMINATION:  Physical Exam  GENERAL:  63 y.o.-year-old African-American female patient lying in the bed with no acute distress.  EYES: Pupils equal, round, reactive to light and accommodation. No scleral icterus. Extraocular muscles intact.  HEENT: Head atraumatic, normocephalic. Oropharynx and nasopharynx clear.  NECK:  Supple, no jugular venous distention. No thyroid enlargement, no tenderness.  LUNGS: Normal breath sounds bilaterally, no wheezing, rales,rhonchi or crepitation. No use of accessory muscles of respiration.  CARDIOVASCULAR: Regular rate and rhythm, S1, S2 normal. No murmurs, rubs, or gallops.  ABDOMEN: Soft, nondistended with mild epigastric and left upper quadrant abdominal pain without rebound tenderness guarding or rigidity. Bowel sounds present. No organomegaly or mass.  EXTREMITIES: No pedal edema, cyanosis, or clubbing.  NEUROLOGIC: Cranial nerves II through  XII are intact. Muscle strength 5/5 in all extremities. Sensation intact. Gait not checked.  PSYCHIATRIC: The patient is alert and oriented x 3.  Normal affect and good eye contact. SKIN: No obvious rash, lesion, or ulcer.   LABORATORY PANEL:   CBC Recent Labs  Lab 10/07/19 2056  WBC 8.9  HGB 10.4*  HCT 32.7*  PLT 398   ------------------------------------------------------------------------------------------------------------------  Chemistries  Recent Labs  Lab 10/07/19 2056  NA 138  K 3.9  CL 107  CO2 19*  GLUCOSE 126*  BUN 29*  CREATININE 0.66  CALCIUM 8.9  AST 16  ALT 13  ALKPHOS 51  BILITOT 0.9   ------------------------------------------------------------------------------------------------------------------  Cardiac Enzymes No results for input(s): TROPONINI in the last 168 hours. ------------------------------------------------------------------------------------------------------------------  RADIOLOGY:  Ct Abdomen Pelvis W Contrast  Result Date: 10/07/2019 CLINICAL DATA:  Coffee ground hematemesis since last night. History of GI bleeds. EXAM: CT ABDOMEN AND PELVIS WITH CONTRAST TECHNIQUE: Multidetector CT imaging of the abdomen and pelvis was performed using the standard protocol following bolus administration of  intravenous contrast. CONTRAST:  140mL OMNIPAQUE IOHEXOL 300 MG/ML  SOLN COMPARISON:  CT abdomen pelvis dated December 29, 2014. FINDINGS: Lower chest: No acute abnormality.  Unchanged large hiatal hernia. Hepatobiliary: Unchanged 4.7 cm simple cyst in the left hepatic lobe. A few other scattered subcentimeter low-density lesions within the liver remain too small to characterize but are also likely cysts. Status post cholecystectomy. No biliary dilatation. Pancreas: Unremarkable. No pancreatic ductal dilatation or surrounding inflammatory changes. Spleen: Normal in size without focal abnormality. Adrenals/Urinary Tract: The adrenal glands are  unremarkable. Unchanged 1.1 cm simple cyst in the lower pole of the left kidney. No renal calculi or hydronephrosis. The bladder is largely decompressed. Stomach/Bowel: Large hiatal hernia containing the majority of the stomach. No bowel wall thickening, distention, or surrounding inflammatory changes. Normal appendix. Vascular/Lymphatic: No significant vascular findings are present. No enlarged abdominal or pelvic lymph nodes. Reproductive: Enlarged, bulky fibroid uterus again noted with progressive calcification of a fibroid at the fundus. No adnexal mass. Other: No abdominal wall hernia or abnormality. No abdominopelvic ascites. No pneumoperitoneum. Musculoskeletal: No acute or significant osseous findings. IMPRESSION: 1.  No acute intra-abdominal process. 2. Unchanged large hiatal hernia containing the majority of the stomach. 3. Enlarged, bulky fibroid uterus, similar to prior study. Electronically Signed   By: Titus Dubin M.D.   On: 10/07/2019 21:56      IMPRESSION AND PLAN:   1.  GI bleeding, of upper GI etiology.  The patient will be admitted to a medical monitored bed.  We will follow serial hemoglobin and hematocrits.  We will continue her on IV Protonix drip after given bolus.  We will hydrate with IV normal saline.  We will continue Carafate.  A GI consultation will be obtained by Dr. Vicente Males, who was notified about the consult.  2.  Acute blood loss anemia with history of chronic anemia.  At this time the patient requires no transfusion.  Will monitor H&H.  The patient was typed and screened.  3.  Fibromyalgia.  We will continue duloxetine, Robaxin and Valium as needed.  We will continue Neurontin and as needed Ultram.  4.  GERD.  The patient will be placed on PPI therapy and will continue Carafate.  5.  Anxiety/depression.  We will continue Cymbalta and PRN Valium and hold Pamelor for now.  6.  DVT prophylaxis.  Medical prophylaxis currently contraindicated due to GI bleeding.  We  will place the patient on SCDs.  7.  GI prophylaxis.  This is addressed above with PPI therapy.   All the records are reviewed and case discussed with ED provider. The plan of care was discussed in details with the patient (and family). I answered all questions. The patient agreed to proceed with the above mentioned plan. Further management will depend upon hospital course.   CODE STATUS: Full code  TOTAL TIME TAKING CARE OF THIS PATIENT: 50 minutes.    Christel Mormon M.D on 10/08/2019 at 12:06 AM  Pager - [phone]  After 6pm go to [email or web address] - Proofreader  [facility]  Office  [phone]  CC: Primary care physician; Patient, No Pcp Per   Note: This dictation was prepared with Dragon dictation along with smaller phrase technology. Any transcriptional errors that result from this process are unintentional.

## 2019-10-09 DIAGNOSIS — D500 Iron deficiency anemia secondary to blood loss (chronic): Secondary | ICD-10-CM

## 2019-10-09 LAB — CBC WITH DIFFERENTIAL/PLATELET
Abs Immature Granulocytes: 0.06 10*3/uL (ref 0.00–0.07)
Basophils Absolute: 0 10*3/uL (ref 0.0–0.1)
Basophils Relative: 1 %
Eosinophils Absolute: 0.2 10*3/uL (ref 0.0–0.5)
Eosinophils Relative: 3 %
HCT: 24 % — ABNORMAL LOW (ref 36.0–46.0)
Hemoglobin: 7.5 g/dL — ABNORMAL LOW (ref 12.0–15.0)
Immature Granulocytes: 1 %
Lymphocytes Relative: 44 %
Lymphs Abs: 2.7 10*3/uL (ref 0.7–4.0)
MCH: 25.6 pg — ABNORMAL LOW (ref 26.0–34.0)
MCHC: 31.3 g/dL (ref 30.0–36.0)
MCV: 81.9 fL (ref 80.0–100.0)
Monocytes Absolute: 0.5 10*3/uL (ref 0.1–1.0)
Monocytes Relative: 8 %
Neutro Abs: 2.7 10*3/uL (ref 1.7–7.7)
Neutrophils Relative %: 43 %
Platelets: 285 10*3/uL (ref 150–400)
RBC: 2.93 MIL/uL — ABNORMAL LOW (ref 3.87–5.11)
RDW: 17 % — ABNORMAL HIGH (ref 11.5–15.5)
WBC: 6.2 10*3/uL (ref 4.0–10.5)
nRBC: 0 % (ref 0.0–0.2)

## 2019-10-09 LAB — ABO/RH: ABO/RH(D): O POS

## 2019-10-09 LAB — BASIC METABOLIC PANEL
Anion gap: 3 — ABNORMAL LOW (ref 5–15)
BUN: 8 mg/dL (ref 8–23)
CO2: 25 mmol/L (ref 22–32)
Calcium: 8.2 mg/dL — ABNORMAL LOW (ref 8.9–10.3)
Chloride: 113 mmol/L — ABNORMAL HIGH (ref 98–111)
Creatinine, Ser: 0.6 mg/dL (ref 0.44–1.00)
GFR calc Af Amer: >60 mL/min (ref >60)
GFR calc non Af Amer: >60 mL/min (ref >60)
Glucose, Bld: 91 mg/dL (ref 70–99)
Potassium: 3.5 mmol/L (ref 3.5–5.1)
Sodium: 141 mmol/L (ref 135–145)

## 2019-10-09 MED ORDER — MAGNESIUM CITRATE PO SOLN
1.0000 | Freq: Once | ORAL | Status: AC
  Administered 2019-10-09: 1 via ORAL
  Filled 2019-10-09: qty 296

## 2019-10-09 MED ORDER — SODIUM CHLORIDE 0.9% IV SOLUTION
Freq: Once | INTRAVENOUS | Status: AC
  Administered 2019-10-09: 12:00:00 via INTRAVENOUS

## 2019-10-09 NOTE — Progress Notes (Addendum)
Arlyss Repress, MD [address]  [address]  [place], [place] [address]  Main: [phone]  Fax: [phone] Pager: [phone]   Subjective: No acute events overnight, patient is receiving second unit of PRBCs.  She denies abdominal pain, nausea, vomiting, shortness of breath  Objective: Vital signs in last 24 hours: Vitals:   10/09/19 1006 10/09/19 1253 10/09/19 1315 10/09/19 1550  BP: 112/61 130/77 117/71 133/76  Pulse: 82 78 81 79  Resp:  18 18 18   Temp: 98 F (36.7 C) 98.2 F (36.8 C) 98.5 F (36.9 C) 98.1 F (36.7 C)  TempSrc: Oral Oral Oral Oral  SpO2: 100% 100% 100% 100%  Weight:      Height:       Weight change:   Intake/Output Summary (Last 24 hours) at 10/09/2019 1637 Last data filed at 10/09/2019 1550 Gross per 24 hour  Intake 1290 ml  Output 1800 ml  Net -510 ml     Exam: Heart:: Regular rate and rhythm, S1S2 present or without murmur or extra heart sounds Lungs: normal and clear to auscultation Abdomen: soft, nontender, normal bowel sounds   Lab Results: CBC Latest Ref Rng & Units 10/09/2019 10/08/2019 10/08/2019  WBC 4.0 - 10.5 K/uL 6.2 - -  Hemoglobin 12.0 - 15.0 g/dL 7.5(L) 8.7(L) 8.4(L)  Hematocrit 36.0 - 46.0 % 24.0(L) 29.4(L) 26.7(L)  Platelets 150 - 400 K/uL 285 - -   CMP Latest Ref Rng & Units 10/09/2019 10/08/2019 10/07/2019  Glucose 70 - 99 mg/dL 91 12/07/2019) 242(A)  BUN 8 - 23 mg/dL 8 22 834(H)  Creatinine 0.44 - 1.00 mg/dL 96(Q 2.29 7.98  Sodium 135 - 145 mmol/L 141 137 138  Potassium 3.5 - 5.1 mmol/L 3.5 3.9 3.9  Chloride 98 - 111 mmol/L 113(H) 110 107  CO2 22 - 32 mmol/L 25 21(L) 19(L)  Calcium 8.9 - 10.3 mg/dL 8.2(L) 8.5(L) 8.9  Total Protein 6.5 - 8.1 g/dL - 6.3(L) 7.4  Total Bilirubin 0.3 - 1.2 mg/dL - 0.7 0.9  Alkaline Phos 38 - 126 U/L - 46 51  AST 15 - 41 U/L - 14(L) 16  ALT 0 - 44 U/L - 12 13    Micro Results: Recent Results (from the past 240 hour(s))  SARS CORONAVIRUS 2 (TAT 6-24 HRS) Nasopharyngeal  Nasopharyngeal Swab     Status: None   Collection Time: 10/07/19 11:26 PM   Specimen: Nasopharyngeal Swab  Result Value Ref Range Status   SARS Coronavirus 2 NEGATIVE NEGATIVE Final    Comment: (NOTE) SARS-CoV-2 target nucleic acids are NOT DETECTED. The SARS-CoV-2 RNA is generally detectable in upper and lower respiratory specimens during the acute phase of infection. Negative results do not preclude SARS-CoV-2 infection, do not rule out co-infections with other pathogens, and should not be used as the sole basis for treatment or other patient management decisions. Negative results must be combined with clinical observations, patient history, and epidemiological information. The expected result is Negative. Fact Sheet for Patients: 12/07/19 Fact Sheet for Healthcare Providers: HairSlick.no This test is not yet approved or cleared by the quierodirigir.com FDA and  has been authorized for detection and/or diagnosis of SARS-CoV-2 by FDA under an Emergency Use Authorization (EUA). This EUA will remain  in effect (meaning this test can be used) for the duration of the COVID-19 declaration under Section 56 4(b)(1) of the Act, 21 U.S.C. section 360bbb-3(b)(1), unless the authorization is terminated or revoked sooner. Performed at [facility], [address], [place], [place] [address]    Studies/Results: Ct Abdomen Pelvis W Contrast  Result Date: 10/07/2019 CLINICAL DATA:  Coffee ground hematemesis since last night. History of GI bleeds. EXAM: CT ABDOMEN AND PELVIS WITH CONTRAST TECHNIQUE: Multidetector CT imaging of the abdomen and pelvis was performed using the standard protocol following bolus administration of intravenous contrast. CONTRAST:  189mL OMNIPAQUE IOHEXOL 300 MG/ML  SOLN COMPARISON:  CT abdomen pelvis dated December 29, 2014. FINDINGS: Lower chest: No acute abnormality.  Unchanged large hiatal hernia.  Hepatobiliary: Unchanged 4.7 cm simple cyst in the left hepatic lobe. A few other scattered subcentimeter low-density lesions within the liver remain too small to characterize but are also likely cysts. Status post cholecystectomy. No biliary dilatation. Pancreas: Unremarkable. No pancreatic ductal dilatation or surrounding inflammatory changes. Spleen: Normal in size without focal abnormality. Adrenals/Urinary Tract: The adrenal glands are unremarkable. Unchanged 1.1 cm simple cyst in the lower pole of the left kidney. No renal calculi or hydronephrosis. The bladder is largely decompressed. Stomach/Bowel: Large hiatal hernia containing the majority of the stomach. No bowel wall thickening, distention, or surrounding inflammatory changes. Normal appendix. Vascular/Lymphatic: No significant vascular findings are present. No enlarged abdominal or pelvic lymph nodes. Reproductive: Enlarged, bulky fibroid uterus again noted with progressive calcification of a fibroid at the fundus. No adnexal mass. Other: No abdominal wall hernia or abnormality. No abdominopelvic ascites. No pneumoperitoneum. Musculoskeletal: No acute or significant osseous findings. IMPRESSION: 1.  No acute intra-abdominal process. 2. Unchanged large hiatal hernia containing the majority of the stomach. 3. Enlarged, bulky fibroid uterus, similar to prior study. Electronically Signed   By: Titus Dubin M.D.   On: 10/07/2019 21:56   Medications:  I have reviewed the patient's current medications. Prior to Admission:  Medications Prior to Admission  Medication Sig Dispense Refill Last Dose  . acetaminophen (TYLENOL) 325 MG tablet Take 650 mg by mouth every 6 (six) hours as needed.   prn at prn  . albuterol (VENTOLIN HFA) 108 (90 Base) MCG/ACT inhaler Inhale 2 puffs into the lungs every 6 (six) hours as needed for wheezing.   prn at prn  . diazepam (VALIUM) 5 MG tablet Take 5 mg by mouth every 6 (six) hours as needed for anxiety.   prn at prn   . DULoxetine (CYMBALTA) 60 MG capsule Take 1 capsule by mouth daily.   Unknown at Unknown  . iron polysaccharides (FERREX 150) 150 MG capsule Take 1 capsule by mouth daily.   Unknown at Unknown  . methocarbamol (ROBAXIN) 500 MG tablet Take 500 mg by mouth 4 (four) times daily.   Unknown at Unknown  . nortriptyline (PAMELOR) 10 MG capsule Take 10 mg by mouth at bedtime.   Unknown at Unknown  . pantoprazole (PROTONIX) 40 MG tablet Take 40 mg by mouth 2 (two) times daily.    Unknown at Unknown  . sucralfate (CARAFATE) 1 g tablet Take 1 g by mouth 4 (four) times daily -  with meals and at bedtime.   Unknown at Unknown  . traMADol (ULTRAM) 50 MG tablet Take by mouth every 6 (six) hours as needed.   prn at prn  . ergocalciferol (DRISDOL) 8000 UNIT/ML drops Take 8,000 Units by mouth daily.   Not Taking at Unknown time  . ferrous sulfate 325 (65 FE) MG tablet Take 325 mg by mouth daily with breakfast.   Not Taking at Unknown time  . gabapentin (NEURONTIN) 100 MG capsule Take 100 mg by mouth 3 (three) times daily.  Not Taking at Unknown time  . predniSONE (DELTASONE) 5 MG tablet Take 5 mg by mouth daily with breakfast.   Not Taking at Unknown time   Scheduled: . ergocalciferol  8,000 Units Oral Daily  . gabapentin  100 mg Oral TID  . [START ON 10/11/2019] pantoprazole  40 mg Intravenous Q12H  . pantoprazole (PROTONIX) IV  40 mg Intravenous Once  . polyethylene glycol-electrolytes  4,000 mL Oral Once  . sucralfate  1 g Oral TID WC & HS   Continuous: . sodium chloride Stopped (10/09/19 1132)  . sodium chloride    . pantoprozole (PROTONIX) infusion 8 mg/hr (10/09/19 1500)   ZOX:[id number]:acetaminophen **OR** acetaminophen, diazepam, ondansetron **OR** ondansetron (ZOFRAN) IV, traZODone Anti-infectives (From admission, onward)   None     Scheduled Meds: . ergocalciferol  8,000 Units Oral Daily  . gabapentin  100 mg Oral TID  . [START ON 10/11/2019] pantoprazole  40 mg Intravenous Q12H  . pantoprazole  (PROTONIX) IV  40 mg Intravenous Once  . polyethylene glycol-electrolytes  4,000 mL Oral Once  . sucralfate  1 g Oral TID WC & HS   Continuous Infusions: . sodium chloride Stopped (10/09/19 1132)  . sodium chloride    . pantoprozole (PROTONIX) infusion 8 mg/hr (10/09/19 1500)   PRN Meds:.acetaminophen **OR** acetaminophen, diazepam, ondansetron **OR** ondansetron (ZOFRAN) IV, traZODone   Assessment: Active Problems:   GI bleeding  Status post units of PRBCs, no active GI bleed  Plan: Continue pantoprazole drip Monitor CBC closely, maintain hemoglobin greater than 7 Plan for EGD and colonoscopy tomorrow by Dr. Tobi BastosAnna Clear liquid diet today N.p.o. past midnight Bowel prep today Patient wishes to assume care with [facility] upon discharge if her insurance allows  Dr. Tobi BastosAnna will cover tomorrow   LOS: 2 days   Rohini Vanga 10/09/2019, 4:37 PM

## 2019-10-09 NOTE — Progress Notes (Signed)
MEWS Guidelines - (patients age 61 and over)  Pt had isolated HR of 120, which was recorded by tele. Pt is asymptomatic. MD notified by MEWS protocol. Will continue to monitor patient.  Yellow - At risk for Deterioration  1. Go to room and assess patient 2. Validate data. Is this patient's baseline? If data confirmed: 3. Is this an acute change? 4. Administer prn meds/treatments as ordered? 5. Note Sepsis score 6. Review goals of care 7. Sports coach and Provider 8. Call RRT nurse as needed. 9. Document patient condition/interventions/response. 10. Increase frequency of vital signs and focused assessments to at least q2h x2. - If stable, then q4h x2 and then q8h or dept. routine. - If unstable, contact Provider & RRT nurse. Prepare for possible transfer. 11. Add entry in progress notes using the smart phrase ".MEWS".

## 2019-10-09 NOTE — Progress Notes (Signed)
[facility] [place] NAME: Julia [name]    MR#:  [id number]  DATE OF BIRTH:  01-May-1956  SUBJECTIVE:  CHIEF COMPLAINT:   Chief Complaint  Patient presents with  . Emesis  . Constipation   Seen and examined on 10/08/2019 AM  No further bleeding  No Abd pain Afebrile  REVIEW OF SYSTEMS:    Review of Systems  Constitutional: Positive for malaise/fatigue. Negative for chills and fever.  HENT: Negative for sore throat.   Eyes: Negative for blurred vision, double vision and pain.  Respiratory: Negative for cough, hemoptysis, shortness of breath and wheezing.   Cardiovascular: Negative for chest pain, palpitations, orthopnea and leg swelling.  Gastrointestinal: Positive for melena. Negative for abdominal pain, constipation, diarrhea, heartburn, nausea and vomiting.  Genitourinary: Negative for dysuria and hematuria.  Musculoskeletal: Negative for back pain and joint pain.  Skin: Negative for rash.  Neurological: Positive for dizziness. Negative for sensory change, speech change, focal weakness and headaches.  Endo/Heme/Allergies: Does not bruise/bleed easily.  Psychiatric/Behavioral: Negative for depression. The patient is not nervous/anxious.     DRUG ALLERGIES:   Allergies  Allergen Reactions  . Egg Yolk Hives  . Influenza Virus Vaccine H5n1 Swelling  . Latex Swelling  . Sulfa Antibiotics Swelling    VITALS:  Blood pressure 117/71, pulse 81, temperature 98.5 F (36.9 C), temperature source Oral, resp. rate 18, height 5\' 5"  (1.651 m), weight 81.6 kg, last menstrual period 10/27/2012, SpO2 100 %.  PHYSICAL EXAMINATION:   Physical Exam  GENERAL:  63 y.o.-year-old patient lying in the bed with no acute distress.  EYES: Pupils equal, round, reactive to light and accommodation. No scleral icterus. Extraocular muscles intact.  HEENT: Head atraumatic, normocephalic. Oropharynx and nasopharynx clear.  NECK:  Supple, no  jugular venous distention. No thyroid enlargement, no tenderness.  LUNGS: Normal breath sounds bilaterally, no wheezing, rales, rhonchi. No use of accessory muscles of respiration.  CARDIOVASCULAR: S1, S2 normal. No murmurs, rubs, or gallops.  ABDOMEN: Soft, nontender, nondistended. Bowel sounds present. No organomegaly or mass.  EXTREMITIES: No cyanosis, clubbing or edema b/l.    NEUROLOGIC: Cranial nerves II through XII are intact. No focal Motor or sensory deficits b/l.   PSYCHIATRIC: The patient is alert and oriented x 3.  SKIN: No obvious rash, lesion, or ulcer.   LABORATORY PANEL:   CBC Recent Labs  Lab 10/09/19 0336  WBC 6.2  HGB 7.5*  HCT 24.0*  PLT 285   ------------------------------------------------------------------------------------------------------------------ Chemistries  Recent Labs  Lab 10/08/19 0227 10/09/19 0336  NA 137 141  K 3.9 3.5  CL 110 113*  CO2 21* 25  GLUCOSE 113* 91  BUN 22 8  CREATININE 0.55 0.60  CALCIUM 8.5* 8.2*  AST 14*  --   ALT 12  --   ALKPHOS 46  --   BILITOT 0.7  --    ------------------------------------------------------------------------------------------------------------------  Cardiac Enzymes No results for input(s): TROPONINI in the last 168 hours. ------------------------------------------------------------------------------------------------------------------  RADIOLOGY:  Ct Abdomen Pelvis W Contrast  Result Date: 10/07/2019 CLINICAL DATA:  Coffee ground hematemesis since last night. History of GI bleeds. EXAM: CT ABDOMEN AND PELVIS WITH CONTRAST TECHNIQUE: Multidetector CT imaging of the abdomen and pelvis was performed using the standard protocol following bolus administration of intravenous contrast. CONTRAST:  139mL OMNIPAQUE IOHEXOL 300 MG/ML  SOLN COMPARISON:  CT abdomen pelvis dated December 29, 2014. FINDINGS: Lower chest: No acute abnormality.  Unchanged large hiatal hernia. Hepatobiliary: Unchanged 4.7 cm  simple cyst in the left hepatic lobe. A few other scattered subcentimeter low-density lesions within the liver remain too small to characterize but are also likely cysts. Status post cholecystectomy. No biliary dilatation. Pancreas: Unremarkable. No pancreatic ductal dilatation or surrounding inflammatory changes. Spleen: Normal in size without focal abnormality. Adrenals/Urinary Tract: The adrenal glands are unremarkable. Unchanged 1.1 cm simple cyst in the lower pole of the left kidney. No renal calculi or hydronephrosis. The bladder is largely decompressed. Stomach/Bowel: Large hiatal hernia containing the majority of the stomach. No bowel wall thickening, distention, or surrounding inflammatory changes. Normal appendix. Vascular/Lymphatic: No significant vascular findings are present. No enlarged abdominal or pelvic lymph nodes. Reproductive: Enlarged, bulky fibroid uterus again noted with progressive calcification of a fibroid at the fundus. No adnexal mass. Other: No abdominal wall hernia or abnormality. No abdominopelvic ascites. No pneumoperitoneum. Musculoskeletal: No acute or significant osseous findings. IMPRESSION: 1.  No acute intra-abdominal process. 2. Unchanged large hiatal hernia containing the majority of the stomach. 3. Enlarged, bulky fibroid uterus, similar to prior study. Electronically Signed   By: Obie Dredge M.D.   On: 10/07/2019 21:56     ASSESSMENT AND PLAN:   *GI bleed.  Most likely upper GI bleed. With associated acute blood loss anemia Patient on Protonix drip.  GI consulted.  Case discussed with Dr. Tobi Bastos. Symptomatic anemia with worsening hemoglobin. \ No need for transfusion today Serial hemoglobin checks. Waiting for GI to schedule EGD/colonoscopy.  *   Fibromyalgia.  We will continue duloxetine, Robaxin and Valium as needed.  We will continue Neurontin and as needed Ultram.  *  Anxiety/depression.  We will continue Cymbalta and PRN Valium and hold Pamelor for  now.  * DVT prophylaxis- SCDs.  All the records are reviewed and case discussed with Care Management/Social Worker Management plans discussed with the patient, family and they are in agreement.  CODE STATUS: FULL CODE  TOTAL TIME TAKING CARE OF THIS PATIENT: 30 minutes.   POSSIBLE D/C IN 2-3 DAYS, DEPENDING ON CLINICAL CONDITION.  Molinda Bailiff Dwanna Goshert M.D on 10/09/2019 at 1:32 PM  Between 7am to 6pm - Pager - [phone]  After 6pm go to [email or web address] - password [email or web address]  [facility]  Office  [phone]  CC: Primary care physician; Patient, No Pcp Per  Note: This dictation was prepared with Dragon dictation along with smaller phrase technology. Any transcriptional errors that result from this process are unintentional.

## 2019-10-09 NOTE — Progress Notes (Signed)
Julia [name] at [facility] [place] NAME: Julia [name]    MR#:  [id number]  DATE OF BIRTH:  01-04-1956  SUBJECTIVE:  CHIEF COMPLAINT:   Chief Complaint  Patient presents with  . Emesis  . Constipation   No further vomiting.  Had one episode of black stool yesterday. No abdominal pain. Tachycardic whenever she gets.  Feels weak.  Afebrile.  COVID-19 test negative.  REVIEW OF SYSTEMS:    Review of Systems  Constitutional: Positive for malaise/fatigue. Negative for chills and fever.  HENT: Negative for sore throat.   Eyes: Negative for blurred vision, double vision and pain.  Respiratory: Negative for cough, hemoptysis, shortness of breath and wheezing.   Cardiovascular: Negative for chest pain, palpitations, orthopnea and leg swelling.  Gastrointestinal: Positive for melena. Negative for abdominal pain, constipation, diarrhea, heartburn, nausea and vomiting.  Genitourinary: Negative for dysuria and hematuria.  Musculoskeletal: Negative for back pain and joint pain.  Skin: Negative for rash.  Neurological: Positive for dizziness. Negative for sensory change, speech change, focal weakness and headaches.  Endo/Heme/Allergies: Does not bruise/bleed easily.  Psychiatric/Behavioral: Negative for depression. The patient is not nervous/anxious.     DRUG ALLERGIES:   Allergies  Allergen Reactions  . Egg Yolk Hives  . Influenza Virus Vaccine H5n1 Swelling  . Latex Swelling  . Sulfa Antibiotics Swelling    VITALS:  Blood pressure 112/61, pulse 82, temperature 98 F (36.7 C), temperature source Oral, resp. rate 19, height 5\' 5"  (1.651 m), weight 81.6 kg, last menstrual period 10/27/2012, SpO2 100 %.  PHYSICAL EXAMINATION:   Physical Exam  GENERAL:  63 y.o.-year-old patient lying in the bed with no acute distress.  EYES: Pupils equal, round, reactive to light and accommodation. No scleral icterus. Extraocular muscles intact.  HEENT: Head  atraumatic, normocephalic. Oropharynx and nasopharynx clear.  NECK:  Supple, no jugular venous distention. No thyroid enlargement, no tenderness.  LUNGS: Normal breath sounds bilaterally, no wheezing, rales, rhonchi. No use of accessory muscles of respiration.  CARDIOVASCULAR: S1, S2 normal. No murmurs, rubs, or gallops.  ABDOMEN: Soft, nontender, nondistended. Bowel sounds present. No organomegaly or mass.  EXTREMITIES: No cyanosis, clubbing or edema b/l.    NEUROLOGIC: Cranial nerves II through XII are intact. No focal Motor or sensory deficits b/l.   PSYCHIATRIC: The patient is alert and oriented x 3.  SKIN: No obvious rash, lesion, or ulcer.   LABORATORY PANEL:   CBC Recent Labs  Lab 10/09/19 0336  WBC 6.2  HGB 7.5*  HCT 24.0*  PLT 285   ------------------------------------------------------------------------------------------------------------------ Chemistries  Recent Labs  Lab 10/08/19 0227 10/09/19 0336  NA 137 141  K 3.9 3.5  CL 110 113*  CO2 21* 25  GLUCOSE 113* 91  BUN 22 8  CREATININE 0.55 0.60  CALCIUM 8.5* 8.2*  AST 14*  --   ALT 12  --   ALKPHOS 46  --   BILITOT 0.7  --    ------------------------------------------------------------------------------------------------------------------  Cardiac Enzymes No results for input(s): TROPONINI in the last 168 hours. ------------------------------------------------------------------------------------------------------------------  RADIOLOGY:  Ct Abdomen Pelvis W Contrast  Result Date: 10/07/2019 CLINICAL DATA:  Coffee ground hematemesis since last night. History of GI bleeds. EXAM: CT ABDOMEN AND PELVIS WITH CONTRAST TECHNIQUE: Multidetector CT imaging of the abdomen and pelvis was performed using the standard protocol following bolus administration of intravenous contrast. CONTRAST:  195mL OMNIPAQUE IOHEXOL 300 MG/ML  SOLN COMPARISON:  CT abdomen pelvis dated December 29, 2014. FINDINGS: Lower chest:  No acute  abnormality.  Unchanged large hiatal hernia. Hepatobiliary: Unchanged 4.7 cm simple cyst in the left hepatic lobe. A few other scattered subcentimeter low-density lesions within the liver remain too small to characterize but are also likely cysts. Status post cholecystectomy. No biliary dilatation. Pancreas: Unremarkable. No pancreatic ductal dilatation or surrounding inflammatory changes. Spleen: Normal in size without focal abnormality. Adrenals/Urinary Tract: The adrenal glands are unremarkable. Unchanged 1.1 cm simple cyst in the lower pole of the left kidney. No renal calculi or hydronephrosis. The bladder is largely decompressed. Stomach/Bowel: Large hiatal hernia containing the majority of the stomach. No bowel wall thickening, distention, or surrounding inflammatory changes. Normal appendix. Vascular/Lymphatic: No significant vascular findings are present. No enlarged abdominal or pelvic lymph nodes. Reproductive: Enlarged, bulky fibroid uterus again noted with progressive calcification of a fibroid at the fundus. No adnexal mass. Other: No abdominal wall hernia or abnormality. No abdominopelvic ascites. No pneumoperitoneum. Musculoskeletal: No acute or significant osseous findings. IMPRESSION: 1.  No acute intra-abdominal process. 2. Unchanged large hiatal hernia containing the majority of the stomach. 3. Enlarged, bulky fibroid uterus, similar to prior study. Electronically Signed   By: Obie Dredge M.D.   On: 10/07/2019 21:56     ASSESSMENT AND PLAN:   *GI bleed.  Most likely upper GI bleed. With associated acute blood loss anemia Patient on Protonix drip.  GI consulted.  Case discussed with Dr. Tobi Bastos. Symptomatic anemia with worsening hemoglobin.  We will transfuse 1 unit packed RBC stat. Serial hemoglobin checks. Waiting for GI to schedule EGD/colonoscopy.  *   Fibromyalgia.  We will continue duloxetine, Robaxin and Valium as needed.  We will continue Neurontin and as needed  Ultram.  *  Anxiety/depression.  We will continue Cymbalta and PRN Valium and hold Pamelor for now.  * DVT prophylaxis- SCDs.  All the records are reviewed and case discussed with Care Management/Social Worker Management plans discussed with the patient, family and they are in agreement.  CODE STATUS: FULL CODE  TOTAL CRITICAL CARE TIME TAKING CARE OF THIS PATIENT: 40 minutes.   POSSIBLE D/C IN 2-3 DAYS, DEPENDING ON CLINICAL CONDITION.  Molinda Bailiff Keishla Oyer M.D on 10/09/2019 at 10:36 AM  Between 7am to 6pm - Pager - [phone]  After 6pm go to [email or web address] - password [email or web address]  [facility]  Office  [phone]  CC: Primary care physician; Patient, No Pcp Per  Note: This dictation was prepared with Dragon dictation along with smaller phrase technology. Any transcriptional errors that result from this process are unintentional.

## 2019-10-09 NOTE — Progress Notes (Addendum)
Patient complaints of being dizzy and nausea . PRN medication given. Pt unable to drink bowel prep at this time. Dr. Marius Ditch notified no new orders given.

## 2019-10-10 ENCOUNTER — Encounter
Admission: EM | Disposition: A | Discharge status: Home / Self Care | Payer: Self-pay | Source: Home / Self Care | Attending: Internal Medicine

## 2019-10-10 ENCOUNTER — Encounter: Payer: Self-pay | Admitting: Anesthesiology

## 2019-10-10 DIAGNOSIS — D509 Iron deficiency anemia, unspecified: Secondary | ICD-10-CM

## 2019-10-10 LAB — HEMOGLOBIN: Hemoglobin: 9.2 g/dL — ABNORMAL LOW (ref 12.0–15.0)

## 2019-10-10 SURGERY — ESOPHAGOGASTRODUODENOSCOPY (EGD) WITH PROPOFOL
Anesthesia: General

## 2019-10-10 MED ORDER — SODIUM CHLORIDE 0.9 % IV SOLN
INTRAVENOUS | Status: DC
  Administered 2019-10-10: 19:00:00 via INTRAVENOUS

## 2019-10-10 MED ORDER — POLYETHYLENE GLYCOL 3350 17 GM/SCOOP PO POWD
1.0000 | Freq: Once | ORAL | Status: AC
  Administered 2019-10-10: 255 g via ORAL
  Filled 2019-10-10: qty 255

## 2019-10-10 NOTE — Progress Notes (Signed)
Wyline Mood , MD [address], [address], [place], [place], [address], [address], [place], [place], [address] Phone: [phone]  Fax: [phone]   Julia [name] is being followed for iron deficiency anemia  Day 2 of follow up   Subjective:  Could not drink prep due to burning sensation - dark bowel movements getting lighter in color    Objective: Vital signs in last 24 hours: Vitals:   10/09/19 1315 10/09/19 1550 10/09/19 2349 10/10/19 0807  BP: 117/71 133/76 (!) 110/50 122/80  Pulse: 81 79 68 71  Resp: 18 18 15 18   Temp: 98.5 F (36.9 C) 98.1 F (36.7 C) 98 F (36.7 C) 98.3 F (36.8 C)  TempSrc: Oral Oral Oral Oral  SpO2: 100% 100% 100% 100%  Weight:      Height:       Weight change:   Intake/Output Summary (Last 24 hours) at 10/10/2019 1237 Last data filed at 10/10/2019 0400 Gross per 24 hour  Intake 1930 ml  Output -  Net 1930 ml     Exam: Heart:: Regular rate and rhythm, S1S2 present or without murmur or extra heart sounds Lungs: normal, clear to auscultation and clear to auscultation and percussion Abdomen: soft, nontender, normal bowel sounds   Lab Results: @LABTEST2 @ Micro Results: Recent Results (from the past 240 hour(s))  SARS CORONAVIRUS 2 (TAT 6-24 HRS) Nasopharyngeal Nasopharyngeal Swab     Status: None   Collection Time: 10/07/19 11:26 PM   Specimen: Nasopharyngeal Swab  Result Value Ref Range Status   SARS Coronavirus 2 NEGATIVE NEGATIVE Final    Comment: (NOTE) SARS-CoV-2 target nucleic acids are NOT DETECTED. The SARS-CoV-2 RNA is generally detectable in upper and lower respiratory specimens during the acute phase of infection. Negative results do not preclude SARS-CoV-2 infection, do not rule out co-infections with other pathogens, and should not be used as the sole basis for treatment or other patient management decisions. Negative results must be combined with clinical observations, patient history, and  epidemiological information. The expected result is Negative. Fact Sheet for Patients: Fact Sheet for Healthcare Providers: 12/07/19 This test is not yet approved or cleared by the [place] FDA and  has been authorized for detection and/or diagnosis of SARS-CoV-2 by FDA under an Emergency Use Authorization (EUA). This EUA will remain  in effect (meaning this test can be used) for the duration of the COVID-19 declaration under Section 56 4(b)(1) of the Act, 21 U.S.C. section 360bbb-3(b)(1), unless the authorization is terminated or revoked sooner. Performed at [facility], [address] [place], [address] [place]    Studies/Results: No results found. Medications: I have reviewed the patient's current medications. Scheduled Meds: . ergocalciferol  8,000 Units Oral Daily  . [START ON 10/11/2019] pantoprazole  40 mg Intravenous Q12H  . pantoprazole (PROTONIX) IV  40 mg Intravenous Once  . sucralfate  1 g Oral TID WC & HS   Continuous Infusions: . sodium chloride Stopped (10/09/19 1132)  . sodium chloride    . pantoprozole (PROTONIX) infusion 8 mg/hr (10/09/19 1656)   PRN Meds:.acetaminophen **OR** acetaminophen, diazepam, ondansetron **OR** ondansetron (ZOFRAN) IV, traZODone   Assessment: Active Problems:   GI bleeding  Julia [name] is a 63 y.o. y/o female with known history of acid reflux large hiatal hernia with esophagitis.  Previously plan was for surgery to treat the hiatal hernia but patient did not go through with it.  She presents to the hospital with abdominal  pain and dark tarry stools.  Hemoglobin is at baseline but has iron defciency   Impression: Likely dark tarry stools related to large hiatal hernia probably bleeding Cameron ulcers or esophagitis  Plan 1.   High-dose PPI which he needs to take long-term. 2. Stop all NSAID use.  3.  EGD+colonoscopy tomorrow (  didn't take prep yesterday) 4.  Very likely will need surgical correction of the hernia which was seen previously. I have discussed alternative options, risks & benefits,  which include, but are not limited to, bleeding, infection, perforation,respiratory complication & drug reaction.  The patient agrees with this plan & written consent will be obtained.       LOS: 3 days   Jonathon Bellows, MD 10/10/2019, 12:37 PM

## 2019-10-10 NOTE — Progress Notes (Signed)
[name] NAME: Julia [name]    MR#:  [id number]  DATE OF BIRTH:  [birth year]  SUBJECTIVE:  CHIEF COMPLAINT:   Chief Complaint  Patient presents with  . Emesis  . Constipation   No further vomiting.  Had one episode of black stool yesterday. No abdominal pain. Tachycardic whenever she gets.  Feels weak.  Afebrile.  COVID-19 test negative.  REVIEW OF SYSTEMS:    Review of Systems  Constitutional: Positive for malaise/fatigue. Negative for chills and fever.  HENT: Negative for sore throat.   Eyes: Negative for blurred vision, double vision and pain.  Respiratory: Negative for cough, hemoptysis, shortness of breath and wheezing.   Cardiovascular: Negative for chest pain, palpitations, orthopnea and leg swelling.  Gastrointestinal: Positive for melena. Negative for abdominal pain, constipation, diarrhea, heartburn, nausea and vomiting.  Genitourinary: Negative for dysuria and hematuria.  Musculoskeletal: Negative for back pain and joint pain.  Skin: Negative for rash.  Neurological: Positive for dizziness. Negative for sensory change, speech change, focal weakness and headaches.  Endo/Heme/Allergies: Does not bruise/bleed easily.  Psychiatric/Behavioral: Negative for depression. The patient is not nervous/anxious.    DRUG ALLERGIES:   Allergies  Allergen Reactions  . Egg Yolk Hives  . Influenza Virus Vaccine H5n1 Swelling  . Latex Swelling  . Sulfa Antibiotics Swelling    VITALS:  Blood pressure 122/80, pulse 71, temperature 98.3 F (36.8 C), temperature source Oral, resp. rate 18, height 5\' 5"  (1.651 m), weight 81.6 kg, last menstrual period 10/27/2012, SpO2 100 %.  PHYSICAL EXAMINATION:   Physical Exam  GENERAL:  63 y.o.-year-old patient lying in the bed with no acute distress.  EYES: Pupils equal, round, reactive to light and accommodation. No scleral icterus. Extraocular muscles intact.  HEENT: Head  atraumatic, normocephalic. Oropharynx and nasopharynx clear.  NECK:  Supple, no jugular venous distention. No thyroid enlargement, no tenderness.  LUNGS: Normal breath sounds bilaterally, no wheezing, rales, rhonchi. No use of accessory muscles of respiration.  CARDIOVASCULAR: S1, S2 normal. No murmurs, rubs, or gallops.  ABDOMEN: Soft, nontender, nondistended. Bowel sounds present. No organomegaly or mass.  EXTREMITIES: No cyanosis, clubbing or edema b/l.    NEUROLOGIC: Cranial nerves II through XII are intact. No focal Motor or sensory deficits b/l.   PSYCHIATRIC: The patient is alert and oriented x 3.  SKIN: No obvious rash, lesion, or ulcer.   LABORATORY PANEL:   CBC Recent Labs  Lab 10/09/19 0336 10/10/19 0509  WBC 6.2  --   HGB 7.5* 9.2*  HCT 24.0*  --   PLT 285  --    ------------------------------------------------------------------------------------------------------------------ Chemistries  Recent Labs  Lab 10/08/19 0227 10/09/19 0336  NA 137 141  K 3.9 3.5  CL 110 113*  CO2 21* 25  GLUCOSE 113* 91  BUN 22 8  CREATININE 0.55 0.60  CALCIUM 8.5* 8.2*  AST 14*  --   ALT 12  --   ALKPHOS 46  --   BILITOT 0.7  --    ------------------------------------------------------------------------------------------------------------------  Cardiac Enzymes No results for input(s): TROPONINI in the last 168 hours. ------------------------------------------------------------------------------------------------------------------  RADIOLOGY:  No results found.   ASSESSMENT AND PLAN:   *GI bleed.  Most likely upper GI bleed. With associated acute blood loss anemia Patient on Protonix drip.  GI consulted.  Case discussed with Dr. Vicente Males. Symptomatic anemia with worsening hemoglobin.   S/p transfusion 1 unit PRBC Serial hemoglobin checks Waiting for GI to schedule EGD/colonoscopy. Couldn't do  her prep overnight with golytely. Requesting a different prep and I let Dr. Tobi Bastos  know. EGD/Colonoscopy tomorrow  *   Fibromyalgia.  We will continue duloxetine, Robaxin and Valium as needed.  We will continue  as needed Ultram.  *  Anxiety/depression.  We will continue Cymbalta and PRN Valium and hold Pamelor for now.  * DVT prophylaxis- SCDs.  All the records are reviewed and case discussed with Care Management/Social Worker Management plans discussed with the patient, family and they are in agreement.  CODE STATUS: FULL CODE  TOTAL TIME TAKING CARE OF THIS PATIENT: 30 minutes.   POSSIBLE D/C IN 2-3 DAYS, DEPENDING ON CLINICAL CONDITION.  Molinda Bailiff Lovett Coffin M.D on 10/10/2019 at 12:07 PM  Between 7am to 6pm - Pager - [phone]  After 6pm go to [email or web address] - password [email or web address]  [facility]  Office  [phone]  CC: Primary care physician; Patient, No Pcp Per  Note: This dictation was prepared with Dragon dictation along with smaller phrase technology. Any transcriptional errors that result from this process are unintentional.

## 2019-10-10 NOTE — Anesthesia Preprocedure Evaluation (Deleted)
Anesthesia Evaluation  Patient identified by MRN, date of birth, ID band Patient awake    Reviewed: Allergy & Precautions, H&P , NPO status , Patient's Chart, lab work & pertinent test results, reviewed documented beta blocker date and time   Airway Mallampati: II   Neck ROM: full    Dental  (+) Teeth Intact   Pulmonary neg pulmonary ROS, asthma , sleep apnea , Current Smoker,    Pulmonary exam normal        Cardiovascular Exercise Tolerance: Good negative cardio ROS Normal cardiovascular exam Rhythm:regular Rate:Normal     Neuro/Psych  Neuromuscular disease negative neurological ROS  negative psych ROS   GI/Hepatic negative GI ROS, Neg liver ROS, GERD  Medicated,  Endo/Other  negative endocrine ROS  Renal/GU negative Renal ROS  negative genitourinary   Musculoskeletal   Abdominal   Peds  Hematology negative hematology ROS (+) Blood dyscrasia, anemia ,   Anesthesia Other Findings Past Medical History: No date: Anemia No date: Fibromyalgia No date: GERD (gastroesophageal reflux disease) No date: Hyperglycemia No date: Neck pain No date: Sleep apnea No date: Tingling No date: Weakness Past Surgical History: No date: BREAST SURGERY     Comment:  breast mass excision x 2 benign No date: CHOLECYSTECTOMY No date: DIAGNOSTIC LAPAROSCOPY WITH REMOVAL OF ECTOPIC PREGNANCY BMI    Body Mass Index:  30.48 kg/m     Reproductive/Obstetrics negative OB ROS                             Anesthesia Physical  Anesthesia Plan  ASA: III  Anesthesia Plan: General   Post-op Pain Management:    Induction:   PONV Risk Score and Plan:   Airway Management Planned:   Additional Equipment:   Intra-op Plan:   Post-operative Plan:   Informed Consent: I have reviewed the patients History and Physical, chart, labs and discussed the procedure including the risks, benefits and alternatives for  the proposed anesthesia with the patient or authorized representative who has indicated his/her understanding and acceptance.     Dental Advisory Given  Plan Discussed with: CRNA  Anesthesia Plan Comments:         Anesthesia Quick Evaluation

## 2019-10-10 NOTE — Plan of Care (Signed)
  Problem: Education: Goal: Knowledge of General Education information will improve Description: Including pain rating scale, medication(s)/side effects and non-pharmacologic comfort measures Outcome: Progressing   Problem: Health Behavior/Discharge Planning: Goal: Ability to manage health-related needs will improve Outcome: Progressing   Problem: Clinical Measurements: Goal: Ability to maintain clinical measurements within normal limits will improve Outcome: Progressing Goal: Will remain free from infection Outcome: Progressing Goal: Diagnostic test results will improve Outcome: Progressing Goal: Respiratory complications will improve Outcome: Progressing Goal: Cardiovascular complication will be avoided Outcome: Progressing   Problem: Activity: Goal: Risk for activity intolerance will decrease Outcome: Progressing   Problem: Nutrition: Goal: Adequate nutrition will be maintained Outcome: Progressing   Problem: Coping: Goal: Level of anxiety will decrease Outcome: Progressing   Problem: Elimination: Goal: Will not experience complications related to bowel motility Outcome: Not Progressing Goal: Will not experience complications related to urinary retention Outcome: Progressing

## 2019-10-11 ENCOUNTER — Encounter
Admission: EM | Disposition: A | Discharge status: Home / Self Care | Payer: Self-pay | Source: Home / Self Care | Attending: Internal Medicine

## 2019-10-11 ENCOUNTER — Encounter: Payer: Self-pay | Admitting: Anesthesiology

## 2019-10-11 ENCOUNTER — Inpatient Hospital Stay: Payer: Medicare Other | Admitting: Anesthesiology

## 2019-10-11 DIAGNOSIS — K449 Diaphragmatic hernia without obstruction or gangrene: Secondary | ICD-10-CM

## 2019-10-11 DIAGNOSIS — K920 Hematemesis: Secondary | ICD-10-CM

## 2019-10-11 DIAGNOSIS — K921 Melena: Secondary | ICD-10-CM

## 2019-10-11 DIAGNOSIS — K648 Other hemorrhoids: Secondary | ICD-10-CM

## 2019-10-11 HISTORY — PX: ESOPHAGOGASTRODUODENOSCOPY (EGD) WITH PROPOFOL: SHX5813

## 2019-10-11 HISTORY — PX: COLONOSCOPY WITH PROPOFOL: SHX5780

## 2019-10-11 LAB — TYPE AND SCREEN
ABO/RH(D): O POS
Antibody Screen: NEGATIVE
Unit division: 0
Unit division: 0
Unit division: 0

## 2019-10-11 LAB — BASIC METABOLIC PANEL
Anion gap: 6 (ref 5–15)
BUN: 6 mg/dL — ABNORMAL LOW (ref 8–23)
CO2: 24 mmol/L (ref 22–32)
Calcium: 8.4 mg/dL — ABNORMAL LOW (ref 8.9–10.3)
Chloride: 107 mmol/L (ref 98–111)
Creatinine, Ser: 0.7 mg/dL (ref 0.44–1.00)
GFR calc Af Amer: >60 mL/min (ref >60)
GFR calc non Af Amer: >60 mL/min (ref >60)
Glucose, Bld: 102 mg/dL — ABNORMAL HIGH (ref 70–99)
Potassium: 3.1 mmol/L — ABNORMAL LOW (ref 3.5–5.1)
Sodium: 137 mmol/L (ref 135–145)

## 2019-10-11 LAB — BPAM RBC
Blood Product Expiration Date: 202011112359
Blood Product Expiration Date: 202011122359
Blood Product Expiration Date: 202011122359
ISSUE DATE / TIME: 202010101300
Unit Type and Rh: 5100
Unit Type and Rh: 5100
Unit Type and Rh: 5100

## 2019-10-11 LAB — HEMOGLOBIN: Hemoglobin: 9.7 g/dL — ABNORMAL LOW (ref 12.0–15.0)

## 2019-10-11 LAB — PREPARE RBC (CROSSMATCH)

## 2019-10-11 SURGERY — ESOPHAGOGASTRODUODENOSCOPY (EGD) WITH PROPOFOL
Anesthesia: General

## 2019-10-11 MED ORDER — PANTOPRAZOLE SODIUM 40 MG PO TBEC
40.0000 mg | DELAYED_RELEASE_TABLET | Freq: Two times a day (BID) | ORAL | Status: DC
  Administered 2019-10-11 – 2019-10-12 (×3): 40 mg via ORAL
  Filled 2019-10-11 (×3): qty 1

## 2019-10-11 MED ORDER — DULOXETINE HCL 60 MG PO CPEP
60.0000 mg | ORAL_CAPSULE | Freq: Every day | ORAL | Status: DC
  Administered 2019-10-12: 10:00:00 60 mg via ORAL
  Filled 2019-10-11 (×2): qty 1

## 2019-10-11 MED ORDER — PROPOFOL 500 MG/50ML IV EMUL
INTRAVENOUS | Status: DC | PRN
  Administered 2019-10-11: 150 ug/kg/min via INTRAVENOUS

## 2019-10-11 MED ORDER — POTASSIUM CHLORIDE 10 MEQ/100ML IV SOLN
10.0000 meq | INTRAVENOUS | Status: DC
  Filled 2019-10-11: qty 100

## 2019-10-11 MED ORDER — PHENYLEPHRINE HCL (PRESSORS) 10 MG/ML IV SOLN
INTRAVENOUS | Status: DC | PRN
  Administered 2019-10-11: 100 ug via INTRAVENOUS

## 2019-10-11 MED ORDER — NORTRIPTYLINE HCL 10 MG PO CAPS
10.0000 mg | ORAL_CAPSULE | Freq: Every day | ORAL | Status: DC
  Filled 2019-10-11 (×2): qty 1

## 2019-10-11 MED ORDER — LIDOCAINE HCL (PF) 2 % IJ SOLN
INTRAMUSCULAR | Status: DC | PRN
  Administered 2019-10-11: 100 mg via INTRADERMAL

## 2019-10-11 MED ORDER — POTASSIUM CHLORIDE CRYS ER 20 MEQ PO TBCR
40.0000 meq | EXTENDED_RELEASE_TABLET | Freq: Once | ORAL | Status: AC
  Administered 2019-10-12: 11:00:00 40 meq via ORAL
  Filled 2019-10-11: qty 2

## 2019-10-11 MED ORDER — PROPOFOL 10 MG/ML IV BOLUS
INTRAVENOUS | Status: DC | PRN
  Administered 2019-10-11 (×3): 20 mg via INTRAVENOUS
  Administered 2019-10-11: 80 mg via INTRAVENOUS

## 2019-10-11 NOTE — Progress Notes (Signed)
   Vonda Antigua, MD [address], [place], [place], [place], [address] [address], [place], [place], [place], [address] Phone: [phone]  Fax: [phone]   Subjective: Reporting brown bowel movements.  No hematochezia.   Objective: Exam: Vital signs in last 24 hours: Vitals:   10/09/19 2349 10/10/19 0807 10/11/19 0014 10/11/19 0815  BP: (!) 110/50 122/80 121/69 118/72  Pulse: 68 71 75 76  Resp: 15 18 18 17   Temp: 98 F (36.7 C) 98.3 F (36.8 C) 99.3 F (37.4 C) 98.4 F (36.9 C)  TempSrc: Oral Oral Oral Oral  SpO2: 100% 100% 100% 100%  Weight:      Height:       Weight change:   Intake/Output Summary (Last 24 hours) at 10/11/2019 1201 Last data filed at 10/11/2019 0400 Gross per 24 hour  Intake 1979.08 ml  Output -  Net 1979.08 ml    General: No acute distress, AAO x3 Abd: Soft, NT/ND, No HSM Skin: Warm, no rashes Neck: Supple, Trachea midline   Lab Results: Lab Results  Component Value Date   WBC 6.2 10/09/2019   HGB 9.7 (L) 10/11/2019   HCT 24.0 (L) 10/09/2019   MCV 81.9 10/09/2019   PLT 285 10/09/2019   Micro Results: Recent Results (from the past 240 hour(s))  SARS CORONAVIRUS 2 (TAT 6-24 HRS) Nasopharyngeal Nasopharyngeal Swab     Status: None   Collection Time: 10/07/19 11:26 PM   Specimen: Nasopharyngeal Swab  Result Value Ref Range Status   SARS Coronavirus 2 NEGATIVE NEGATIVE Final    Comment: (NOTE) SARS-CoV-2 target nucleic acids are NOT DETECTED. The SARS-CoV-2 RNA is generally detectable in upper and lower respiratory specimens during the acute phase of infection. Negative results do not preclude SARS-CoV-2 infection, do not rule out co-infections with other pathogens, and should not be used as the sole basis for treatment or other patient management decisions. Negative results must be combined with clinical observations, patient history, and epidemiological information. The expected result is Negative. Fact Sheet for  Patients: SugarRoll.be Fact Sheet for Healthcare Providers: https://www.woods-mathews.com/ This test is not yet approved or cleared by the [place] FDA and  has been authorized for detection and/or diagnosis of SARS-CoV-2 by FDA under an Emergency Use Authorization (EUA). This EUA will remain  in effect (meaning this test can be used) for the duration of the COVID-19 declaration under Section 56 4(b)(1) of the Act, 21 U.S.C. section 360bbb-3(b)(1), unless the authorization is terminated or revoked sooner. Performed at [facility], [place] [address], [place], [place] [address]    Studies/Results: No results found. Medications:  Scheduled Meds: . ergocalciferol  8,000 Units Oral Daily  . pantoprazole  40 mg Intravenous Q12H  . pantoprazole (PROTONIX) IV  40 mg Intravenous Once  . potassium chloride  40 mEq Oral Once  . sucralfate  1 g Oral TID WC & HS   Continuous Infusions: . sodium chloride    . sodium chloride 20 mL/hr at 10/10/19 1855   PRN Meds:.acetaminophen **OR** acetaminophen, diazepam, ondansetron **OR** ondansetron (ZOFRAN) IV, traZODone   Assessment: Active Problems:   GI bleeding    Plan: We will proceed with endoscopic procedures today to evaluate previously known hiatal hernia and any other sources of bleeding Continue n.p.o. and follow-up procedure results and findings   LOS: 4 days   Vonda Antigua, MD 10/11/2019, 12:01 PM

## 2019-10-11 NOTE — Progress Notes (Signed)
Patient refused duloxetine, wants to order food from out of facility , educated she is Cardiac diet .

## 2019-10-11 NOTE — Op Note (Signed)
[facility] Gastroenterology Patient Name: Julia [name] Procedure Date: 10/11/2019 2:10 PM MRN: [id number] Account #: [id number] Date of Birth: 02/20/1956 Admit Type: Outpatient Age: 63 Room: [facility] ENDO ROOM 4 Gender: Female Note Status: Finalized Procedure:            Upper GI endoscopy Indications:          Melena Providers:            Jhanvi Drakeford B. Bonna Gains MD, MD Medicines:            Monitored Anesthesia Care Complications:        No immediate complications. Procedure:            Pre-Anesthesia Assessment:                       - Prior to the procedure, a History and Physical was                        performed, and patient medications, allergies and                        sensitivities were reviewed. The patient's tolerance of                        previous anesthesia was reviewed.                       - The risks and benefits of the procedure and the                        sedation options and risks were discussed with the                        patient. All questions were answered and informed                        consent was obtained.                       - Patient identification and proposed procedure were                        verified prior to the procedure by the physician, the                        nurse, the anesthesiologist, the anesthetist and the                        technician. The procedure was verified in the procedure                        room.                       - ASA Grade Assessment: II - A patient with mild                        systemic disease.                       After obtaining informed consent, the endoscope was  passed under direct vision. Throughout the procedure,                        the patient's blood pressure, pulse, and oxygen                        saturations were monitored continuously. The Endoscope                        was introduced through the mouth, and advanced to  the                        third part of duodenum. The upper GI endoscopy was                        accomplished with ease. The patient tolerated the                        procedure well. Findings:      The examined esophagus was normal.      A large hiatal hernia was present.      A single localized, 5 mm non-bleeding erosion was found in the gastric       fundus. There were no stigmata of recent bleeding.      The entire examined stomach was normal.      The duodenal bulb, second portion of the duodenum and examined duodenum       were normal. Impression:           - Normal esophagus.                       - Large hiatal hernia.                       - Non-bleeding erosive gastropathy.                       - Normal stomach.                       - Normal duodenal bulb, second portion of the duodenum                        and examined duodenum.                       - No specimens collected. Recommendation:       - Refer to a surgeon for hiatal hernia repair.                       - Perform a colonoscopy today.                       - Continue present medications.                       - Patient has a contact number available for                        emergencies. The signs and symptoms of potential  delayed complications were discussed with the patient.                        Return to normal activities tomorrow. Written discharge                        instructions were provided to the patient.                       - The findings and recommendations were discussed with                        the patient. Procedure Code(s):    --- Professional ---                       [phone], Esophagogastroduodenoscopy, flexible, transoral;                        diagnostic, including collection of specimen(s) by                        brushing or washing, when performed (separate procedure) Diagnosis Code(s):    --- Professional ---                       K44.9,  Diaphragmatic hernia without obstruction or                        gangrene                       K31.89, Other diseases of stomach and duodenum                       K92.1, Melena (includes Hematochezia) CPT copyright 2019 American Medical Association. All rights reserved. The codes documented in this report are preliminary and upon coder review may  be revised to meet current compliance requirements.  Melodie Bouillon, MD Michel Bickers B. Maximino Greenland MD, MD 10/11/2019 2:46:20 PM This report has been signed electronically. Number of Addenda: 0 Note Initiated On: 10/11/2019 2:10 PM Estimated Blood Loss: Estimated blood loss: none.      [facility]

## 2019-10-11 NOTE — Progress Notes (Signed)
Patient returned to floor from endoscopy , alert and oriented c/o pain 7/10 prn tylenol given . Tolerated PO fluids and meds without difficulty

## 2019-10-11 NOTE — Transfer of Care (Signed)
Immediate Anesthesia Transfer of Care Note  Patient: Julia [name]  Procedure(s) Performed: ESOPHAGOGASTRODUODENOSCOPY (EGD) WITH PROPOFOL (N/A ) COLONOSCOPY WITH PROPOFOL (N/A )  Patient Location: PACU  Anesthesia Type:General  Level of Consciousness: awake, alert  and oriented  Airway & Oxygen Therapy: Patient Spontanous Breathing and Patient connected to nasal cannula oxygen  Post-op Assessment: Report given to RN and Post -op Vital signs reviewed and stable  Post vital signs: Reviewed and stable  Last Vitals:  Vitals Value Taken Time  BP 109/95 10/11/19 1514  Temp    Pulse 83 10/11/19 1513  Resp 21 10/11/19 1513  SpO2 100 % 10/11/19 1513  Vitals shown include unvalidated device data.  Last Pain:  Vitals:   10/11/19 1317  TempSrc: Tympanic  PainSc:       Patients Stated Pain Goal: 0 (93/57/01 [id number])  Complications: No apparent anesthesia complications

## 2019-10-11 NOTE — Op Note (Addendum)
[facility] Gastroenterology Patient Name: Julia [name] Procedure Date: 10/11/2019 2:10 PM MRN: [id number] Account #: [id number] Date of Birth: 18-Oct-1956 Admit Type: Inpatient Age: 63 Room: [facility] ENDO ROOM 4 Gender: Female Note Status: Finalized Procedure:            Colonoscopy Indications:          Melena Providers:            Christerpher Clos B. Bonna Gains MD, MD Medicines:            Monitored Anesthesia Care Complications:        No immediate complications. Procedure:            Pre-Anesthesia Assessment:                       - Prior to the procedure, a History and Physical was                        performed, and patient medications, allergies and                        sensitivities were reviewed. The patient's tolerance of                        previous anesthesia was reviewed.                       - The risks and benefits of the procedure and the                        sedation options and risks were discussed with the                        patient. All questions were answered and informed                        consent was obtained.                       - Patient identification and proposed procedure were                        verified prior to the procedure by the physician, the                        nurse, the anesthesiologist, the anesthetist and the                        technician. The procedure was verified in the                        pre-procedure area in the procedure room in the                        endoscopy suite.                       - Prophylactic Antibiotics: The patient does not                        require prophylactic antibiotics.                       -  ASA Grade Assessment: II - A patient with mild                        systemic disease.                       - After reviewing the risks and benefits, the patient                        was deemed in satisfactory condition to undergo the                         procedure.                       - Monitored anesthesia care was determined to be                        medically necessary for this procedure based on review                        of the patient's medical history, medications, and                        prior anesthesia history.                       - The anesthesia plan was to use monitored anesthesia                        care (MAC).                       After obtaining informed consent, the colonoscope was                        passed under direct vision. Throughout the procedure,                        the patient's blood pressure, pulse, and oxygen                        saturations were monitored continuously. The                        Colonoscope was introduced through the anus and                        advanced to the the cecum, identified by appendiceal                        orifice and ileocecal valve. The colonoscopy was                        performed with ease. The patient tolerated the                        procedure well. The quality of the bowel preparation                        was fair. Findings:      The perianal and digital rectal examinations  were normal.      The entire examined colon appeared normal.      Non-bleeding internal hemorrhoids were found during retroflexion. Impression:           - Preparation of the colon was fair.                       - The entire examined colon is normal.                       - Non-bleeding internal hemorrhoids.                       - No specimens collected. Recommendation:       - Refer to a surgeon for evaluation of large hiatal                        hernia repair.                       - PPI BID due to erosion seen within hiatal hernia                       - Continue Serial CBCs and transfuse PRN                       - Avoid NSAIDs except Aspirin if medically indicated by                        PCP                       - Continue Serial CBCs and transfuse  PRN                       - Return to GI clinic in 2 weeks.                       - The findings and recommendations were discussed with                        the patient.                       - Due to the poor prep, the examination was limited and                        underlying small or flat lesions or polyps could have                        been present underneath the stool. Pt should continue                        follow up with her outpatient GI to continue polyp                        surveillance colonoscopy based on her previous                        procedures. Procedure Code(s):    --- Professional ---  45378, Colonoscopy, flexible; diagnostic, including                        collection of specimen(s) by brushing or washing, when                        performed (separate procedure) Diagnosis Code(s):    --- Professional ---                       K64.8, Other hemorrhoids                       K92.1, Melena (includes Hematochezia) CPT copyright 2019 American Medical Association. All rights reserved. The codes documented in this report are preliminary and upon coder review may  be revised to meet current compliance requirements.  Vonda Antigua, MD Margretta Sidle B. Bonna Gains MD, MD 10/11/2019 3:20:13 PM This report has been signed electronically. Number of Addenda: 0 Note Initiated On: 10/11/2019 2:10 PM Scope Withdrawal Time: 0 hours 5 minutes 41 seconds  Total Procedure Duration: 0 hours 18 minutes 19 seconds  Estimated Blood Loss: Estimated blood loss: none.      [facility]

## 2019-10-11 NOTE — Care Management Important Message (Signed)
Important Message  Patient Details  Name: Julia [name] MRN: [id number] Date of Birth: Oct 12, 1956   Medicare Important Message Given:  Yes     Dannette Barbara 10/11/2019, 10:34 AM

## 2019-10-11 NOTE — Progress Notes (Signed)
[place] [facility] [place] NAME: Julia [name]    MR#:  [id number]  DATE OF BIRTH:  02/10/1956  SUBJECTIVE:   Patient states she is feeling well this morning.  She denies any additional bleeding episodes.  She states that her stools have started to turn clear.  No nausea, vomiting, or abdominal pain.  REVIEW OF SYSTEMS:    Review of Systems  Constitutional: Positive for malaise/fatigue. Negative for chills and fever.  HENT: Negative for sore throat.   Eyes: Negative for blurred vision, double vision and pain.  Respiratory: Negative for cough, hemoptysis, shortness of breath and wheezing.   Cardiovascular: Negative for chest pain, palpitations, orthopnea and leg swelling.  Gastrointestinal: Negative for abdominal pain, constipation, diarrhea, heartburn, melena, nausea and vomiting.  Genitourinary: Negative for dysuria and hematuria.  Musculoskeletal: Negative for back pain and joint pain.  Skin: Negative for rash.  Neurological: Positive for dizziness. Negative for sensory change, speech change, focal weakness and headaches.  Endo/Heme/Allergies: Does not bruise/bleed easily.  Psychiatric/Behavioral: Negative for depression. The patient is not nervous/anxious.    DRUG ALLERGIES:   Allergies  Allergen Reactions  . Egg Yolk Hives  . Influenza Virus Vaccine H5n1 Swelling  . Latex Swelling  . Sulfa Antibiotics Swelling    VITALS:  Blood pressure 118/72, pulse 76, temperature 98.4 F (36.9 C), temperature source Oral, resp. rate 17, height 5\' 5"  (1.651 m), weight 81.6 kg, last menstrual period 10/27/2012, SpO2 100 %.  PHYSICAL EXAMINATION:   Physical Exam  GENERAL:  63 y.o.-year-old patient lying in the bed with no acute distress.  EYES: Pupils equal, round, reactive to light and accommodation. No scleral icterus. Extraocular muscles intact.  HEENT: Head atraumatic, normocephalic. Oropharynx and nasopharynx clear.  NECK:  Supple,  no jugular venous distention. No thyroid enlargement, no tenderness.  LUNGS: Normal breath sounds bilaterally, no wheezing, rales, rhonchi. No use of accessory muscles of respiration.  CARDIOVASCULAR: RRR, S1, S2 normal. No murmurs, rubs, or gallops.  ABDOMEN: Soft, nontender, nondistended. Bowel sounds present. No organomegaly or mass.  EXTREMITIES: No cyanosis, clubbing or edema b/l.    NEUROLOGIC: Cranial nerves II through XII are intact. No focal Motor or sensory deficits b/l.   PSYCHIATRIC: The patient is alert and oriented x 3.  SKIN: No obvious rash, lesion, or ulcer.   LABORATORY PANEL:   CBC Recent Labs  Lab 10/09/19 0336  10/11/19 0404  WBC 6.2  --   --   HGB 7.5*   < > 9.7*  HCT 24.0*  --   --   PLT 285  --   --    < > = values in this interval not displayed.   ------------------------------------------------------------------------------------------------------------------ Chemistries  Recent Labs  Lab 10/08/19 0227  10/11/19 0404  NA 137   < > 137  K 3.9   < > 3.1*  CL 110   < > 107  CO2 21*   < > 24  GLUCOSE 113*   < > 102*  BUN 22   < > 6*  CREATININE 0.55   < > 0.70  CALCIUM 8.5*   < > 8.4*  AST 14*  --   --   ALT 12  --   --   ALKPHOS 46  --   --   BILITOT 0.7  --   --    < > = values in this interval not displayed.   ------------------------------------------------------------------------------------------------------------------  Cardiac Enzymes No results for input(s): TROPONINI  in the last 168 hours. ------------------------------------------------------------------------------------------------------------------  RADIOLOGY:  No results found.   ASSESSMENT AND PLAN:   GI bleed- likely upper GI bleed. -Transition from Protonix drip to IV Protonix twice daily -GI following-plan for EGD and colonoscopy today  Acute blood loss anemia- hemoglobin has stabilized s/p 1 unit PRBC yesterday. -Recheck CBC in the morning  Hypokalemia -Replete  and recheck  Large hiatal hernia- seen on CT abdomen/pelvis.  Hernias containing the majority of the stomach. -Will need to follow-up with general surgery as an outpatient for consideration of surgery  Fibromyalgia-stable.   -Continue duloxetine, Robaxin, tramadol  Anxiety/depression-stable -Continue Cymbalta, Valium  DVT prophylaxis- SCDs  All the records are reviewed and case discussed with Care Management/Social Worker Management plans discussed with the patient, family and they are in agreement.  CODE STATUS: FULL CODE  TOTAL TIME TAKING CARE OF THIS PATIENT: 40 minutes.   POSSIBLE D/C IN 1-2 DAYS, DEPENDING ON CLINICAL CONDITION.  Jinny Blossom Pakou Rainbow M.D on 10/11/2019 at 12:31 PM  Between 7am to 6pm - Pager - [phone]  After 6pm go to [email or web address] - password [email or web address]  [facility]  Office  [phone]  CC: Primary care physician; Patient, No Pcp Per  Note: This dictation was prepared with Dragon dictation along with smaller phrase technology. Any transcriptional errors that result from this process are unintentional.

## 2019-10-11 NOTE — Anesthesia Preprocedure Evaluation (Addendum)
Anesthesia Evaluation  Patient identified by MRN, date of birth, ID band Patient awake    Reviewed: Allergy & Precautions, NPO status , Patient's Chart, lab work & pertinent test results, reviewed documented beta blocker date and time   Airway Mallampati: II  TM Distance: >3 FB     Dental  (+) Chipped   Pulmonary sleep apnea , Current Smoker,           Cardiovascular      Neuro/Psych  Neuromuscular disease CVA, No Residual Symptoms    GI/Hepatic GERD  ,  Endo/Other    Renal/GU      Musculoskeletal  (+) Fibromyalgia -  Abdominal   Peds  Hematology  (+) anemia ,   Anesthesia Other Findings CVA in 2010.  Reproductive/Obstetrics                            Anesthesia Physical Anesthesia Plan  ASA: III  Anesthesia Plan: General   Post-op Pain Management:    Induction: Intravenous  PONV Risk Score and Plan:   Airway Management Planned:   Additional Equipment:   Intra-op Plan:   Post-operative Plan:   Informed Consent: I have reviewed the patients History and Physical, chart, labs and discussed the procedure including the risks, benefits and alternatives for the proposed anesthesia with the patient or authorized representative who has indicated his/her understanding and acceptance.       Plan Discussed with: CRNA  Anesthesia Plan Comments:         Anesthesia Quick Evaluation

## 2019-10-11 NOTE — Anesthesia Postprocedure Evaluation (Signed)
Anesthesia Post Note  Patient: Julia [name]  Procedure(s) Performed: ESOPHAGOGASTRODUODENOSCOPY (EGD) WITH PROPOFOL (N/A ) COLONOSCOPY WITH PROPOFOL (N/A )  Patient location during evaluation: Endoscopy Anesthesia Type: General Level of consciousness: awake and alert and oriented Pain management: pain level controlled Vital Signs Assessment: post-procedure vital signs reviewed and stable Respiratory status: spontaneous breathing, nonlabored ventilation and respiratory function stable Cardiovascular status: blood pressure returned to baseline and stable Postop Assessment: no signs of nausea or vomiting Anesthetic complications: no     Last Vitals:  Vitals:   10/11/19 1316 10/11/19 1317  BP:  (!) 112/58  Pulse:  71  Resp: 16 17  Temp:  36.9 C  SpO2:  100%    Last Pain:  Vitals:   10/11/19 1514  TempSrc:   PainSc: Asleep                 Raegen Tarpley

## 2019-10-11 NOTE — Anesthesia Post-op Follow-up Note (Signed)
Anesthesia QCDR form completed.        

## 2019-10-11 NOTE — Anesthesia Procedure Notes (Signed)
Date/Time: 10/11/2019 2:38 PM Performed by: Nelda Marseille, CRNA Pre-anesthesia Checklist: Patient identified, Emergency Drugs available, Suction available, Patient being monitored and Timeout performed Oxygen Delivery Method: Nasal cannula

## 2019-10-12 ENCOUNTER — Encounter: Payer: Self-pay | Admitting: Gastroenterology

## 2019-10-12 LAB — CBC
HCT: 29.1 % — ABNORMAL LOW (ref 36.0–46.0)
Hemoglobin: 9.4 g/dL — ABNORMAL LOW (ref 12.0–15.0)
MCH: 26.3 pg (ref 26.0–34.0)
MCHC: 32.3 g/dL (ref 30.0–36.0)
MCV: 81.3 fL (ref 80.0–100.0)
Platelets: 300 10*3/uL (ref 150–400)
RBC: 3.58 MIL/uL — ABNORMAL LOW (ref 3.87–5.11)
RDW: 17.2 % — ABNORMAL HIGH (ref 11.5–15.5)
WBC: 6.9 10*3/uL (ref 4.0–10.5)
nRBC: 0 % (ref 0.0–0.2)

## 2019-10-12 LAB — BASIC METABOLIC PANEL
Anion gap: 6 (ref 5–15)
BUN: 7 mg/dL — ABNORMAL LOW (ref 8–23)
CO2: 26 mmol/L (ref 22–32)
Calcium: 8.5 mg/dL — ABNORMAL LOW (ref 8.9–10.3)
Chloride: 107 mmol/L (ref 98–111)
Creatinine, Ser: 0.64 mg/dL (ref 0.44–1.00)
GFR calc Af Amer: >60 mL/min (ref >60)
GFR calc non Af Amer: >60 mL/min (ref >60)
Glucose, Bld: 96 mg/dL (ref 70–99)
Potassium: 3 mmol/L — ABNORMAL LOW (ref 3.5–5.1)
Sodium: 139 mmol/L (ref 135–145)

## 2019-10-12 NOTE — TOC Transition Note (Addendum)
Transition of Care [facility]) - CM/SW Discharge Note   Patient Details  Name: Deissy Guilbert MRN: [id number] Date of Birth: [birth year]  Transition of Care [facility]) CM/SW Contact:  Su Hilt, RN Phone Number: 10/12/2019, 10:16 AM   Clinical Narrative:     Met with the patient in the room to discuss DC PLan and needs She lives at home alone but has her son help her,  She does not need [place] services but feels she will benefit from a rollator,  I notified Betsy with [employer] of the need for DME, she will deliver to the patients home, the patient is aware and states understanding The patient stated that she is up to date with her PCP and she can afford her medications  NO additional needs  Final next level of care: Home/Self Care Barriers to Discharge: Barriers Resolved   Patient Goals and CMS Choice Patient states their goals for this hospitalization and ongoing recovery are:: go home      Discharge Placement                       Discharge Plan and Services   Discharge Planning Services: CM Consult            DME Arranged: Walker rolling with seat DME Agency: [employer] Date DME Agency Contacted: 10/12/19 Time DME Agency Contacted: [phone] Representative spoke with at DME Agency: Gwinda Passe HH Arranged: NA          Social Determinants of Health ([place]) Interventions     Readmission Risk Interventions No flowsheet data found.

## 2019-10-12 NOTE — Discharge Summary (Signed)
[facility] NAME: Julia [name]    MR#:  [id number]  DATE OF BIRTH:  06-28-1956  DATE OF ADMISSION:  10/07/2019   ADMITTING PHYSICIAN: Christel Mormon, MD  DATE OF DISCHARGE: 10/12/19  PRIMARY CARE PHYSICIAN: Patient, No Pcp Per   ADMISSION DIAGNOSIS:  Coffee ground emesis [K92.0] DISCHARGE DIAGNOSIS:  Active Problems:   GI bleeding  SECONDARY DIAGNOSIS:   Past Medical History:  Diagnosis Date  . Anemia   . Fibromyalgia   . GERD (gastroesophageal reflux disease)   . Hyperglycemia   . Neck pain   . Sleep apnea   . Stroke ([place])   . Tingling   . Weakness    HOSPITAL COURSE:   Julia [name] is a 63 year old female who presented to the ED with nausea and coffee-ground emesis.  Hemoglobin was 10.4.  CT abdomen/pelvis showed an unchanged large hiatal hernia containing the majority of the stomach.  She was admitted for further management.  GI bleed- likely due to gastritis -EGD 10/12 with large hiatal hernia and nonbleeding erosive gastropathy -Colonoscopy 10/12 was unremarkable, but prep was poor -Initially on Protonix drip and then transitioned to Protonix 40 mg twice daily on discharge -Follow-up with GI in 2 weeks  Acute blood loss anemia- hemoglobin has stabilized s/p 1 unit PRBC on 10/11. -Needs CBC rechecked as an outpatient  Hypokalemia -Repleted -Recheck potassium as an outpatient  Large hiatal hernia- seen on CT abdomen/pelvis.  Hernia is containing the majority of the stomach. -Will need to be referred to general surgery as an outpatient for consideration of surgery  Fibromyalgia-stable.  -Continued duloxetine, Robaxin, tramadol  Anxiety/depression-stable -Continued Cymbalta, Valium  DISCHARGE CONDITIONS:  Erosive gastropathy Acute blood loss anemia Hypokalemia Large hiatal hernia Fibromyalgia Anxiety/depression CONSULTS OBTAINED:  Treatment Team:  Hillary Bow, MD Jonathon Bellows, MD  DRUG ALLERGIES:   Allergies  Allergen Reactions  . Egg Yolk Hives  . Influenza Virus Vaccine H5n1 Swelling  . Latex Swelling  . Sulfa Antibiotics Swelling   DISCHARGE MEDICATIONS:   Allergies as of 10/12/2019      Reactions   Egg Yolk Hives   Influenza Virus Vaccine H5n1 Swelling   Latex Swelling   Sulfa Antibiotics Swelling      Medication List    STOP taking these medications   predniSONE 5 MG tablet Commonly known as: DELTASONE     TAKE these medications   acetaminophen 325 MG tablet Commonly known as: TYLENOL Take 650 mg by mouth every 6 (six) hours as needed.   albuterol 108 (90 Base) MCG/ACT inhaler Commonly known as: VENTOLIN HFA Inhale 2 puffs into the lungs every 6 (six) hours as needed for wheezing.   diazepam 5 MG tablet Commonly known as: VALIUM Take 5 mg by mouth every 6 (six) hours as needed for anxiety.   DULoxetine 60 MG capsule Commonly known as: CYMBALTA Take 1 capsule by mouth daily.   ergocalciferol 8000 UNIT/ML drops Commonly known as: DRISDOL Take 8,000 Units by mouth daily.   Ferrex 150 150 MG capsule Generic drug: iron polysaccharides Take 1 capsule by mouth daily.   ferrous sulfate 325 (65 FE) MG tablet Take 325 mg by mouth daily with breakfast.   gabapentin 100 MG capsule Commonly known as: NEURONTIN Take 100 mg by mouth 3 (three) times daily.   methocarbamol 500 MG tablet Commonly known as: ROBAXIN Take 500 mg by mouth 4 (four) times daily.   nortriptyline 10 MG capsule Commonly known  as: PAMELOR Take 10 mg by mouth at bedtime.   pantoprazole 40 MG tablet Commonly known as: PROTONIX Take 40 mg by mouth 2 (two) times daily.   sucralfate 1 g tablet Commonly known as: CARAFATE Take 1 g by mouth 4 (four) times daily -  with meals and at bedtime.   traMADol 50 MG tablet Commonly known as: ULTRAM Take by mouth every 6 (six) hours as needed.            Durable Medical Equipment  (From admission, onward)          Start     Ordered   10/12/19 1002  For home use only DME 4 wheeled rolling walker with seat  Once    Question:  Patient needs a walker to treat with the following condition  Answer:  Weakness   10/12/19 1002           DISCHARGE INSTRUCTIONS:  1.  Follow-up with PCP in 5 days 2.  Follow-up with GI in 2 weeks 3.  Patient needs to be referred to general surgery for consideration of repair of large hiatal hernia 4.  Take Protonix 40 mg twice daily DIET:  Cardiac diet DISCHARGE CONDITION:  Stable ACTIVITY:  Activity as tolerated OXYGEN:  Home Oxygen: No.  Oxygen Delivery: room air DISCHARGE LOCATION:  home   If you experience worsening of your admission symptoms, develop shortness of breath, life threatening emergency, suicidal or homicidal thoughts you must seek medical attention immediately by calling 911 or calling your MD immediately  if symptoms less severe.  You Must read complete instructions/literature along with all the possible adverse reactions/side effects for all the Medicines you take and that have been prescribed to you. Take any new Medicines after you have completely understood and accpet all the possible adverse reactions/side effects.   Please note  You were cared for by a hospitalist during your hospital stay. If you have any questions about your discharge medications or the care you received while you were in the hospital after you are discharged, you can call the unit and asked to speak with the hospitalist on call if the hospitalist that took care of you is not available. Once you are discharged, your primary care physician will handle any further medical issues. Please note that NO REFILLS for any discharge medications will be authorized once you are discharged, as it is imperative that you return to your primary care physician (or establish a relationship with a primary care physician if you do not have one) for your aftercare needs so that they can reassess  your need for medications and monitor your lab values.    On the day of Discharge:  VITAL SIGNS:  Blood pressure 115/62, pulse 66, temperature 98.4 F (36.9 C), temperature source Oral, resp. rate 15, height 5\' 5"  (1.651 m), weight 81.6 kg, last menstrual period 10/27/2012, SpO2 100 %. PHYSICAL EXAMINATION:  GENERAL:  63 y.o.-year-old patient sitting up in chair in no acute distress.  Well-appearing. EYES: Pupils equal, round, reactive to light and accommodation. No scleral icterus. Extraocular muscles intact.  HEENT: Head atraumatic, normocephalic. Oropharynx and nasopharynx clear.  NECK:  Supple, no jugular venous distention. No thyroid enlargement, no tenderness.  LUNGS: Normal breath sounds bilaterally, no wheezing, rales,rhonchi or crepitation. No use of accessory muscles of respiration.  CARDIOVASCULAR: RRR, S1, S2 normal. No murmurs, rubs, or gallops.  ABDOMEN: Soft, non-tender, non-distended. Bowel sounds present. No organomegaly or mass.  EXTREMITIES: No pedal edema, cyanosis, or  clubbing.  NEUROLOGIC: Cranial nerves II through XII are intact. Muscle strength 5/5 in all extremities. Sensation intact. Gait not checked.  PSYCHIATRIC: The patient is alert and oriented x 3.  SKIN: No obvious rash, lesion, or ulcer.  DATA REVIEW:   CBC Recent Labs  Lab 10/12/19 0503  WBC 6.9  HGB 9.4*  HCT 29.1*  PLT 300    Chemistries  Recent Labs  Lab 10/08/19 0227  10/12/19 0503  NA 137   < > 139  K 3.9   < > 3.0*  CL 110   < > 107  CO2 21*   < > 26  GLUCOSE 113*   < > 96  BUN 22   < > 7*  CREATININE 0.55   < > 0.64  CALCIUM 8.5*   < > 8.5*  AST 14*  --   --   ALT 12  --   --   ALKPHOS 46  --   --   BILITOT 0.7  --   --    < > = values in this interval not displayed.     Microbiology Results  Results for orders placed or performed during the hospital encounter of 10/07/19  SARS CORONAVIRUS 2 (TAT 6-24 HRS) Nasopharyngeal Nasopharyngeal Swab     Status: None   Collection  Time: 10/07/19 11:26 PM   Specimen: Nasopharyngeal Swab  Result Value Ref Range Status   SARS Coronavirus 2 NEGATIVE NEGATIVE Final    Comment: (NOTE) SARS-CoV-2 target nucleic acids are NOT DETECTED. The SARS-CoV-2 RNA is generally detectable in upper and lower respiratory specimens during the acute phase of infection. Negative results do not preclude SARS-CoV-2 infection, do not rule out co-infections with other pathogens, and should not be used as the sole basis for treatment or other patient management decisions. Negative results must be combined with clinical observations, patient history, and epidemiological information. The expected result is Negative. Fact Sheet for Patients: HairSlick.nohttps://www.fda.gov/media/138098/download Fact Sheet for Healthcare Providers: quierodirigir.comhttps://www.fda.gov/media/138095/download This test is not yet approved or cleared by the [place] FDA and  has been authorized for detection and/or diagnosis of SARS-CoV-2 by FDA under an Emergency Use Authorization (EUA). This EUA will remain  in effect (meaning this test can be used) for the duration of the COVID-19 declaration under Section 56 4(b)(1) of the Act, 21 U.S.C. section 360bbb-3(b)(1), unless the authorization is terminated or revoked sooner. Performed at [facility], [address] [place], [place] [address]     RADIOLOGY:  No results found.   Management plans discussed with the patient, family and they are in agreement.  CODE STATUS: Full Code   TOTAL TIME TAKING CARE OF THIS PATIENT: 45 minutes.    Jinny BlossomKaty D Wallace Cogliano M.D on 10/12/2019 at 10:39 AM  Between 7am to 6pm - Pager - [phone]  After 6pm go to [email or web address] - Social research officer, governmentpassword EPAS [facility]  [facility]  Office  [phone]  CC: Primary care physician; Patient, No Pcp Per   Note: This dictation was prepared with Dragon dictation along with smaller phrase technology. Any transcriptional errors that result  from this process are unintentional.

## 2019-10-12 NOTE — Progress Notes (Signed)
Patient discharged with belongings with family. Requested a sling for her right stating she did not feel the heaviness a previous "infiltration" until she was standing up for a while.  Informed her it would take a doctors order to receive one, but patient's ride had already gone to car to move to medical mall entrance and she chose to go without it.  Informed her she may be able to receive one from her pcp.  No s/s of distress noted.

## 2019-10-12 NOTE — Plan of Care (Signed)
Patient with no s/s of bleeding, pain discomfort.  Patient reports misunderstanding concerning refusal of oral potassium last night- thought it was IV.  Patient took supplemental dose this am.    Problem: Education: Goal: Knowledge of General Education information will improve Description: Including pain rating scale, medication(s)/side effects and non-pharmacologic comfort measures 10/12/2019 1138 by Wallene Dales, RN Outcome: Completed/Met 10/12/2019 1138 by Wallene Dales, RN Outcome: Progressing   Problem: Health Behavior/Discharge Planning: Goal: Ability to manage health-related needs will improve 10/12/2019 1138 by Wallene Dales, RN Outcome: Completed/Met 10/12/2019 1138 by Wallene Dales, RN Outcome: Progressing   Problem: Clinical Measurements: Goal: Ability to maintain clinical measurements within normal limits will improve 10/12/2019 1138 by Wallene Dales, RN Outcome: Completed/Met 10/12/2019 1138 by Wallene Dales, RN Outcome: Progressing Goal: Will remain free from infection 10/12/2019 1138 by Wallene Dales, RN Outcome: Completed/Met 10/12/2019 1138 by Wallene Dales, RN Outcome: Progressing Goal: Diagnostic test results will improve 10/12/2019 1138 by Wallene Dales, RN Outcome: Completed/Met 10/12/2019 1138 by Wallene Dales, RN Outcome: Progressing Goal: Respiratory complications will improve 10/12/2019 1138 by Wallene Dales, RN Outcome: Completed/Met 10/12/2019 1138 by Wallene Dales, RN Outcome: Progressing Goal: Cardiovascular complication will be avoided 10/12/2019 1138 by Wallene Dales, RN Outcome: Completed/Met 10/12/2019 1138 by Wallene Dales, RN Outcome: Progressing   Problem: Activity: Goal: Risk for activity intolerance will decrease 10/12/2019 1138 by Wallene Dales, RN Outcome: Completed/Met 10/12/2019 1138 by Wallene Dales, RN Outcome: Progressing   Problem: Nutrition: Goal: Adequate nutrition will be maintained 10/12/2019 1138  by Wallene Dales, RN Outcome: Completed/Met 10/12/2019 1138 by Wallene Dales, RN Outcome: Progressing   Problem: Coping: Goal: Level of anxiety will decrease 10/12/2019 1138 by Wallene Dales, RN Outcome: Completed/Met 10/12/2019 1138 by Wallene Dales, RN Outcome: Progressing   Problem: Elimination: Goal: Will not experience complications related to bowel motility 10/12/2019 1138 by Wallene Dales, RN Outcome: Completed/Met 10/12/2019 1138 by Wallene Dales, RN Outcome: Progressing Goal: Will not experience complications related to urinary retention 10/12/2019 1138 by Wallene Dales, RN Outcome: Completed/Met 10/12/2019 1138 by Wallene Dales, RN Outcome: Progressing   Problem: Pain Managment: Goal: General experience of comfort will improve 10/12/2019 1138 by Wallene Dales, RN Outcome: Completed/Met 10/12/2019 1138 by Wallene Dales, RN Outcome: Progressing   Problem: Safety: Goal: Ability to remain free from injury will improve 10/12/2019 1138 by Wallene Dales, RN Outcome: Completed/Met 10/12/2019 1138 by Wallene Dales, RN Outcome: Progressing   Problem: Skin Integrity: Goal: Risk for impaired skin integrity will decrease 10/12/2019 1138 by Wallene Dales, RN Outcome: Completed/Met 10/12/2019 1138 by Wallene Dales, RN Outcome: Progressing

## 2019-10-12 NOTE — Discharge Instructions (Signed)
It was so nice to meet you during this hospitalization!  You came into the hospital with nausea and vomiting. We think you were having some bleeding from your stomach. You had an endoscopy, which showed some irritation of your stomach. Please take the protonix (pantoprazole) twice a day.   Take care, Dr. Brett Albino

## 2019-11-24 ENCOUNTER — Other Ambulatory Visit: Payer: Self-pay

## 2019-11-24 ENCOUNTER — Encounter: Payer: Self-pay | Admitting: Gastroenterology

## 2019-11-24 ENCOUNTER — Encounter (INDEPENDENT_AMBULATORY_CARE_PROVIDER_SITE_OTHER): Payer: Self-pay | Admitting: Gastroenterology

## 2019-11-24 NOTE — Progress Notes (Signed)
Already established with [facility] GI appointment canceled

## 2020-03-17 ENCOUNTER — Ambulatory Visit: Payer: Medicare Other | Attending: Internal Medicine

## 2020-03-17 DIAGNOSIS — Z23 Encounter for immunization: Secondary | ICD-10-CM

## 2020-03-17 NOTE — Progress Notes (Signed)
   Covid-19 Vaccination Clinic  Name:  Ladaysha Soutar    MRN: [id number] DOB: 1956-01-23  03/17/2020  Ms. Zaakirah Kistner was observed post Covid-19 immunization for 15 minutes without incident. She was provided with Vaccine Information Sheet and instruction to access the V-Safe system.   Ms. Isa Hitz was instructed to call 911 with any severe reactions post vaccine: Marland Kitchen Difficulty breathing  . Swelling of face and throat  . A fast heartbeat  . A bad rash all over body  . Dizziness and weakness   Immunizations Administered    Name Date Dose VIS Date Route   Pfizer COVID-19 Vaccine 03/17/2020  4:11 PM 0.3 mL 12/10/2019 Intramuscular   Manufacturer: [employer], [employer]   Lot: [id number]   NDC: [address]-2

## 2020-04-10 ENCOUNTER — Ambulatory Visit: Payer: Medicare Other

## 2020-04-12 ENCOUNTER — Ambulatory Visit: Payer: Medicare Other | Attending: Internal Medicine

## 2020-04-12 DIAGNOSIS — Z23 Encounter for immunization: Secondary | ICD-10-CM

## 2020-04-12 NOTE — Progress Notes (Signed)
   Covid-19 Vaccination Clinic  Name:  Twylah Bennetts    MRN: [id number] DOB: 1956-05-01  04/12/2020  Ms. Vada Swift was observed post Covid-19 immunization for 15 minutes without incident. She was provided with Vaccine Information Sheet and instruction to access the V-Safe system.   Ms. Arminta Gamm was instructed to call 911 with any severe reactions post vaccine: Marland Kitchen Difficulty breathing  . Swelling of face and throat  . A fast heartbeat  . A bad rash all over body  . Dizziness and weakness   Immunizations Administered    Name Date Dose VIS Date Route   Pfizer COVID-19 Vaccine 04/12/2020  3:18 PM 0.3 mL 12/10/2019 Intramuscular   Manufacturer: [employer], [employer]   Lot: [id number]   NDC: [address]-1

## 2020-07-17 ENCOUNTER — Other Ambulatory Visit: Payer: Self-pay | Admitting: Ophthalmology

## 2020-07-17 DIAGNOSIS — H539 Unspecified visual disturbance: Secondary | ICD-10-CM

## 2020-07-26 ENCOUNTER — Ambulatory Visit
Admission: RE | Admit: 2020-07-26 | Discharge: 2020-07-26 | Disposition: A | Discharge status: Home / Self Care | Payer: Medicare Other | Source: Ambulatory Visit | Attending: Ophthalmology | Admitting: Ophthalmology

## 2020-07-26 ENCOUNTER — Other Ambulatory Visit: Payer: Self-pay

## 2020-07-26 DIAGNOSIS — H539 Unspecified visual disturbance: Secondary | ICD-10-CM | POA: Diagnosis present

## 2020-10-25 ENCOUNTER — Encounter: Payer: Medicare Other | Admitting: Obstetrics and Gynecology

## 2021-01-05 ENCOUNTER — Ambulatory Visit: Payer: Medicare Other | Attending: Internal Medicine

## 2021-01-05 DIAGNOSIS — Z23 Encounter for immunization: Secondary | ICD-10-CM

## 2021-01-05 NOTE — Progress Notes (Signed)
   Covid-19 Vaccination Clinic  Name:  Dane Bloch    MRN: [id number] DOB: 1956-08-28  01/05/2021  Ms. Beena Catano was observed post Covid-19 immunization for 15 minutes without incident. She was provided with Vaccine Information Sheet and instruction to access the V-Safe system.   Ms. Gem Conkle was instructed to call 911 with any severe reactions post vaccine: Marland Kitchen Difficulty breathing  . Swelling of face and throat  . A fast heartbeat  . A bad rash all over body  . Dizziness and weakness   Immunizations Administered    Name Date Dose VIS Date Route   Pfizer COVID-19 Vaccine 01/05/2021  3:32 PM 0.3 mL 10/18/2020 Intramuscular   Manufacturer: [employer], [employer]   Lot: [id number]   NDC: [address]-2

## 2021-01-12 ENCOUNTER — Ambulatory Visit: Payer: Self-pay | Admitting: *Deleted

## 2021-01-12 NOTE — Telephone Encounter (Signed)
Patient called on home number, left VM to return the call if still have COVID questions. Cell phone mailbox is full.

## 2021-01-12 NOTE — Telephone Encounter (Signed)
Pt calling and is requesting to speak with a nurse. She states that she has covid questions. Please advise.   Attempted to call patient- no answer and mailbox is full- unable to leave message.
